# Patient Record
Sex: Male | Born: 1960
Health system: Southern US, Community
[De-identification: ages and names within clinical notes are randomized; demographics above are authoritative.]

## PROBLEM LIST (undated history)

## (undated) DIAGNOSIS — I10 Essential (primary) hypertension: Secondary | ICD-10-CM

## (undated) DIAGNOSIS — M545 Low back pain, unspecified: Secondary | ICD-10-CM

## (undated) HISTORY — PX: KNEE ARTHROPLASTY: SHX992

## (undated) HISTORY — PX: BACK SURGERY: SHX140

## (undated) HISTORY — DX: Low back pain, unspecified: M54.50

## (undated) HISTORY — PX: APPENDECTOMY: SHX54

---

## 2014-04-18 DIAGNOSIS — G8929 Other chronic pain: Secondary | ICD-10-CM | POA: Insufficient documentation

## 2014-04-18 DIAGNOSIS — M549 Dorsalgia, unspecified: Secondary | ICD-10-CM

## 2014-07-01 DIAGNOSIS — M542 Cervicalgia: Secondary | ICD-10-CM | POA: Insufficient documentation

## 2014-10-06 ENCOUNTER — Ambulatory Visit: Payer: Self-pay | Admitting: Physical Therapy

## 2014-10-23 ENCOUNTER — Ambulatory Visit: Payer: Worker's Compensation | Attending: Physical Medicine and Rehabilitation | Admitting: Physical Therapy

## 2014-10-23 ENCOUNTER — Encounter: Payer: Self-pay | Admitting: Physical Therapy

## 2014-10-23 DIAGNOSIS — M542 Cervicalgia: Secondary | ICD-10-CM | POA: Diagnosis not present

## 2014-10-23 DIAGNOSIS — M436 Torticollis: Secondary | ICD-10-CM | POA: Diagnosis not present

## 2014-10-23 DIAGNOSIS — M792 Neuralgia and neuritis, unspecified: Secondary | ICD-10-CM | POA: Diagnosis present

## 2014-10-23 NOTE — Therapy (Signed)
[place], [name], [address] Phone: [phone]   Fax:  [phone]  Physical Therapy Evaluation  Patient Details  Name: Roy Watson MRN: [id number] Date of Birth: 02-11-1961 Referring Provider:  Juanda Bond, MD  Encounter Date: 10/23/2014      PT End of Session - 10/23/14 1626    Visit Number 1   Number of Visits 16  1 visit approved initially   Date for PT Re-Evaluation 12/18/14   PT Start Time 9242   PT Stop Time 1632   PT Time Calculation (min) 49 min   Activity Tolerance Patient tolerated treatment well      History reviewed. No pertinent past medical history.  History reviewed. No pertinent past surgical history.  There were no vitals taken for this visit.  Visit Diagnosis:  Radicular pain in right arm  Stiffness of cervical spine  Trigger point with neck pain      Subjective Assessment - 10/23/14 1549    Symptoms Neck pain, Rt. shoulder pain, radiating to hand, numbness in Rt. hand/arm, denies weakness, neck stiffness.  Symptoms limit work abilities, sleep/rest and ability to be physically active.    Pertinent History Injured at work July 2015, lumbar surgery x2 (1989, 2000) and HTN   Limitations Reading;Sitting;House hold activities  looking up, and side to side/driving   How long can you sit comfortably? afftects low back   How long can you stand comfortably? affects low back (not neck)   Diagnostic tests XR, MRI per patient (bulging disc)   Patient Stated Goals Pain relief, avoiding surgery and spinal epidural injections   Currently in Pain? Yes   Pain Score 7    Pain Location Neck   Pain Orientation Mid;Posterior;Right   Pain Descriptors / Indicators Aching;Sore   Pain Type Chronic pain   Pain Radiating Towards Rt. UE    Pain Onset More than a month ago   Pain Frequency Intermittent   Aggravating Factors  prolonged looking up or down   Pain Relieving  Factors massage, heat, ice, rest   Multiple Pain Sites Yes   Pain Score 4   Pain Type Chronic pain   Pain Location Back          [facility] PT Assessment - 10/23/14 1621    Assessment   Medical Diagnosis cervical pain   Onset Date 03/26/14   Prior Therapy No   Precautions   Precautions None   Restrictions   Weight Bearing Restrictions No   Prior Function   Vocation Full time employment   Vocation Requirements building mgr, lifting, climbing ladders   Cognition   Overall Cognitive Status Within Functional Limits for tasks assessed   Observation/Other Assessments   Focus on Therapeutic Outcomes (FOTO)  46% limited   Sensation   Light Touch Appears Intact   AROM   Cervical Flexion 35   Cervical Extension 42   Cervical - Right Side Bend 35   Cervical - Left Side Bend 35   Cervical - Right Rotation 55   Cervical - Left Rotation 50  pain   Strength   Right Shoulder Flexion 4/5   Right Shoulder ABduction 4+/5   Left Shoulder Flexion 4+/5   Left Shoulder ABduction 4+/5   Palpation   Palpation Trigger points R/L upper traps, sore and painful in posterior cervicals and Rt shoulder           [facility] Adult PT Treatment/Exercise - 10/23/14 1621    Posture/Postural  Control   Postural Limitations Forward head;Decreased thoracic kyphosis   Neck Exercises: Supine   Neck Retraction 5 reps;5 secs   Manual Therapy   Manual Therapy Myofascial release;Passive ROM;Manual Traction   Myofascial Release uboccipitals   Passive ROM rotation to L and lat flexion   Manual Traction initially increased pain in Rt. UE then was reduced with slight L lateral flexion   Neck Exercises: Stretches   Upper Trapezius Stretch 3 reps;20 seconds      Self care: posture, initial HEP technique and purpose, trigger points, POC (traction- patient has and reports a "bowling ball"feeling after use (had feels heavy and difficulty to lift)  Dry needling benefit and therapeutic massage, spinal curves  (normal)        PT Short Term Goals - 10/23/14 2106    PT SHORT TERM GOAL #1   Title Pt will be I with initial HEP   Time 4   Period Weeks   Status New   PT SHORT TERM GOAL #2   Title Pt wil be able to report pain decreased in neck and Rt. UE to less than 5/10 most days of the week following work days   Time 4   Period Weeks   Status New   PT SHORT TERM GOAL #3   Title Pt will be able to turn head in each direction with min pain to ease driving.    Time 4   Period Weeks   Status New   PT SHORT TERM GOAL #4   Title Pt will be able to demo safe lifting, body mechanics related to work tasks.    Time 4   Period Weeks   Status New           PT Long Term Goals - 10/23/14 2109    PT LONG TERM GOAL #1   Title Pt will be I with advanced HEP for neck and UE/posture   Time 8   Period Weeks   Status New   PT LONG TERM GOAL #2   Title Pt wil be able to lift arms overhad and maintain for short periods without increased neck pain (work/maintenance)   Time 8   Period Weeks   Status New   PT LONG TERM GOAL #3   Title Pt will demo full AROM in cervical spine with no pain, only stretch    Time 8   Period Weeks   Status New   PT LONG TERM GOAL #4   Title Pt will demo 5/5 UE strength (Rt.) for work   Time 8   Period Weeks   Status New   PT LONG TERM GOAL #5   Title Pt will be able to read for 20 min or more with min increase in neck pain   Time 8   Period Weeks   Status New   Additional Long Term Goals   Additional Long Term Goals Yes   PT LONG TERM GOAL #6   Title Pt will reduce FOTO limitation to 35% or less limited to demo increased functional mobility   Time [place] - 10/23/14 2047    Clinical Impression Statement Patient presents with signs and symptoms consistent with cervical radiculopathy with Rt. UE pain, sensory disturbance, mild Rt sided weakness.  He was able to reduce symptoms after light cervical stretching  an postural correction, manual theapy.  Pt will benefit from skilled therapeutic intervention in order to improve on the following deficits Decreased range of motion;Impaired flexibility;Postural dysfunction;Impaired sensation;Increased fascial restricitons;Pain;Impaired UE functional use;Decreased mobility;Decreased strength   Rehab Potential Excellent   PT Frequency 2x / week   PT Duration 8 weeks   PT Treatment/Interventions ADLs/Self Care Home Management;Moist Heat;Therapeutic activities;Patient/family education;Therapeutic exercise;Traction;Passive range of motion;Dry needling;Manual techniques;Ultrasound;Cryotherapy;Neuromuscular re-education;Functional mobility training;Electrical Stimulation   PT Next Visit Plan uppe trap stretching, levator, retraction: add manual PT and try US/IFC   PT Home Exercise Plan chin tuck and UT stretch   Consulted and Agree with Plan of Care Patient         Problem List There are no active problems to display for this patient.   Roy Watson 10/23/2014, 9:28 PM  [facility] [address] [place], [name], [address] Phone: [phone]   Fax:  [phone]   Raeford Razor, PT 10/24/2014 10:57 AM Phone: [phone] Fax: [phone]

## 2014-10-23 NOTE — Patient Instructions (Signed)
Nod: Deep Cervical Flexor Retrain - Supine   Nod head, tipping chin down. Tighten muscles in back of throat. Hold __5_ seconds. Do ___5-10   times, __2-3_ times per day.  http://ss.exer.us/192   Copyright  VHI. All rights reserved.    Flexibility: Upper Trapezius Stretch   Gently grasp right side of head while reaching behind back with other hand. Tilt head away until a gentle stretch is felt. Hold _20-30___ seconds. Repeat __3__ times to Lt. Then 1 time to the Rt.  Do _1___ sets per session. Do ___2_ sessions per day.  http://orth.exer.us/340   Copyright  VHI. All rights reserved.

## 2014-10-24 NOTE — Addendum Note (Signed)
Addended by: Raeford Razor L on: 10/24/2014 12:32 PM   Modules accepted: Orders

## 2014-11-11 ENCOUNTER — Ambulatory Visit: Payer: Worker's Compensation | Attending: Physical Medicine and Rehabilitation | Admitting: Rehabilitation

## 2014-11-11 DIAGNOSIS — M542 Cervicalgia: Secondary | ICD-10-CM | POA: Insufficient documentation

## 2014-11-11 DIAGNOSIS — M436 Torticollis: Secondary | ICD-10-CM

## 2014-11-11 DIAGNOSIS — M792 Neuralgia and neuritis, unspecified: Secondary | ICD-10-CM | POA: Diagnosis not present

## 2014-11-11 NOTE — Patient Instructions (Signed)
Levator Stretch   Grasp seat or sit on hand on side to be stretched. Turn head toward other side and look down. Use hand on head to gently stretch neck in that position. Hold _20-30___ seconds. Repeat on other side. Repeat _3___ times. Do __2__ sessions per day.  Copyright  VHI. All rights reserved.  Chin Protraction / Retraction   Slide head forward keeping chin level. Slide head back, pulling chin in. Hold each position __5_ seconds. Repeat _10__ times. Do _2__ sessions per day.  Copyright  VHI. All rights reserved.

## 2014-11-11 NOTE — Therapy (Signed)
[place], [name], [address] Phone: [phone]   Fax:  [phone]  Physical Therapy Treatment  Patient Details  Name: Roy Watson MRN: [id number] Date of Birth: [birth year] Referring Provider:  Juanda Bond, MD  Encounter Date: 11/11/2014      PT End of Session - 11/11/14 1643    Visit Number 2   Number of Visits 18  approved   Date for PT Re-Evaluation 12/18/14   PT Start Time 0430   PT Stop Time 0516   PT Time Calculation (min) 46 min      No past medical history on file.  No past surgical history on file.  There were no vitals taken for this visit.  Visit Diagnosis:  Stiffness of cervical spine  Radicular pain in right arm  Trigger point with neck pain      Subjective Assessment - 11/11/14 1633    Symptoms a little sore, about the same. intermittent arm pain. Mild numbness worse at night   Currently in Pain? Yes   Pain Score 7    Pain Location Neck   Pain Orientation Mid;Posterior;Right   Pain Descriptors / Indicators Aching;Sore   Pain Type Chronic pain   Pain Onset More than a month ago   Pain Frequency Intermittent   Aggravating Factors  prolonged computer work   Pain Relieving Factors massage heat ice   Multiple Pain Sites No                    [facility] Adult PT Treatment/Exercise - 11/11/14 0001    Neck Exercises: Supine   Neck Retraction 10 reps;5 secs   Modalities   Modalities Electrical Stimulation;Moist Heat   Moist Heat Therapy   Number Minutes Moist Heat 15 Minutes   Moist Heat Location --  neck   Electrical Stimulation   Electrical Stimulation Location neck   Electrical Stimulation Action IFC   Electrical Stimulation Parameters 9   Electrical Stimulation Goals Pain   Manual Therapy   Manual Therapy Massage;Passive ROM   Massage cervical paraspinals, upper traps   Passive ROM lateral flexion c spine   Neck Exercises: Stretches   Upper Trapezius Stretch 3 reps;20 seconds   Levator Stretch 3 reps;20 seconds                PT Education - 11/11/14 1642    Education provided Yes   Education Details Self Care: work station set up ; hep: levator stretch, chin tucks seated   Person(s) Educated Patient   Methods Explanation   Comprehension Verbalized understanding          PT Short Term Goals - 10/23/14 2106    PT SHORT TERM GOAL #1   Title Pt will be I with initial HEP   Time 4   Period Weeks   Status New   PT SHORT TERM GOAL #2   Title Pt wil be able to report pain decreased in neck and Rt. UE to less than 5/10 most days of the week following work days   Time 4   Period Weeks   Status New   PT SHORT TERM GOAL #3   Title Pt will be able to turn head in each direction with min pain to ease driving.    Time 4   Period Weeks   Status New   PT SHORT TERM GOAL #4   Title Pt will be able to demo safe lifting, body mechanics related  to work tasks.    Time 4   Period Weeks   Status New           PT Long Term Goals - 10/23/14 2109    PT LONG TERM GOAL #1   Title Pt will be I with advanced HEP for neck and UE/posture   Time 8   Period Weeks   Status New   PT LONG TERM GOAL #2   Title Pt wil be able to lift arms overhad and maintain for short periods without increased neck pain (work/maintenance)   Time 8   Period Weeks   Status New   PT LONG TERM GOAL #3   Title Pt will demo full AROM in cervical spine with no pain, only stretch    Time 8   Period Weeks   Status New   PT LONG TERM GOAL #4   Title Pt will demo 5/5 UE strength (Rt.) for work   Time 8   Period Weeks   Status New   PT LONG TERM GOAL #5   Title Pt will be able to read for 20 min or more with min increase in neck pain   Time 8   Period Weeks   Status New   Additional Long Term Goals   Additional Long Term Goals Yes   PT LONG TERM GOAL #6   Title Pt will reduce FOTO limitation to 35% or less limited to demo increased  functional mobility   Time 8   Period Weeks   Status New               Plan - 11/11/14 1643    Clinical Impression Statement able to add to hep levator stretch and chin tucks in seated. Trial of IFC with HMP in clinic. Increased radicular sx during massage and PROM.    PT Next Visit Plan try [place], try traction continue therex        Problem List There are no active problems to display for this patient.   Dorene Ar, [place] 11/11/2014, 5:10 PM  [place], [name], [address] Phone: [phone]   Fax:  [phone]

## 2014-11-13 ENCOUNTER — Ambulatory Visit: Payer: Worker's Compensation | Attending: Physical Medicine and Rehabilitation | Admitting: Physical Therapy

## 2014-11-13 DIAGNOSIS — M436 Torticollis: Secondary | ICD-10-CM | POA: Insufficient documentation

## 2014-11-13 DIAGNOSIS — M792 Neuralgia and neuritis, unspecified: Secondary | ICD-10-CM | POA: Insufficient documentation

## 2014-11-13 DIAGNOSIS — M542 Cervicalgia: Secondary | ICD-10-CM | POA: Diagnosis not present

## 2014-11-13 NOTE — Therapy (Signed)
[place], [name], [address] Phone: [phone]   Fax:  [phone]  Physical Therapy Treatment  Patient Details  Name: Roy Watson MRN: [id number] Date of Birth: [birth year] Referring Provider:  Juanda Bond, MD  Encounter Date: 11/13/2014      PT End of Session - 11/13/14 1721    Visit Number 3   Number of Visits 18   Date for PT Re-Evaluation 12/18/14   PT Start Time 0435   Activity Tolerance Patient tolerated treatment well      No past medical history on file.  No past surgical history on file.  There were no vitals taken for this visit.  Visit Diagnosis:  Radicular pain in right arm  Trigger point with neck pain  Stiffness of cervical spine      Subjective Assessment - 11/13/14 1714    Symptoms a little sore, about the same. intermittent arm pain. Mild numbness worse at night   Currently in Pain? Yes  see above   Pain Location Neck   Pain Orientation Right;Mid;Posterior   Pain Radiating Towards shoulder to 4,5th digits   Aggravating Factors  sleeping wrong   Pain Relieving Factors moving around some   Effect of Pain on Daily Activities wakes him from sleep   Multiple Pain Sites No                    [facility] Adult PT Treatment/Exercise - 11/13/14 1700    Traction   Type of Traction Cervical  New machine protocol for disc involvement with muscle guardi   Min (lbs) 5   Max (lbs) 15   Time 17   Manual Therapy   Manual Therapy --  neuromusculat trigger point release.  Rt neck shoulder                PT Education - 11/13/14 1800    Education provided Yes   Education Details How to check for pounds of pressure for home traction   Person(s) Educated Patient   Methods Explanation;Demonstration   Comprehension Verbalized understanding          PT Short Term Goals - 11/13/14 1726    PT SHORT TERM GOAL #1   Status On-going   PT SHORT TERM  GOAL #2   Status On-going   PT SHORT TERM GOAL #3   Status Unable to assess   PT SHORT TERM GOAL #4   Status Unable to assess           PT Long Term Goals - 11/13/14 1726    PT LONG TERM GOAL #1   Status On-going   PT LONG TERM GOAL #2   Status Unable to assess   PT LONG TERM GOAL #3   Status Unable to assess   PT LONG TERM GOAL #4   Status Unable to assess               Plan - 11/13/14 1722    Clinical Impression Statement small cue for body posture for stretching, manual today with traction trial.  Able to decrease shoulder pain.   PT Next Visit Plan try [place], traction continue therex        Problem List There are no active problems to display for this patient.  Melvenia Needles, PTA 11/13/2014 6:01 PM Phone: [phone] Fax: [phone]  Melvenia Needles 11/13/2014, 6:01 PM  [facility] [address] [place], [name], [address] Phone: [phone]  Fax:  [phone]

## 2014-11-13 NOTE — Patient Instructions (Signed)
Use much less pounds of pressure for home traction.

## 2014-11-18 ENCOUNTER — Ambulatory Visit: Payer: Worker's Compensation | Admitting: Physical Therapy

## 2014-11-18 DIAGNOSIS — M436 Torticollis: Secondary | ICD-10-CM

## 2014-11-18 DIAGNOSIS — M792 Neuralgia and neuritis, unspecified: Secondary | ICD-10-CM | POA: Diagnosis not present

## 2014-11-18 DIAGNOSIS — M542 Cervicalgia: Secondary | ICD-10-CM

## 2014-11-18 NOTE — Therapy (Signed)
[place], [name], [address] Phone: [phone]   Fax:  [phone]  Physical Therapy Treatment  Patient Details  Name: Roy Watson MRN: [id number] Date of Birth: September 09, 1961 Referring Provider:  Juanda Bond, MD  Encounter Date: 11/18/2014      PT End of Session - 11/18/14 1803    Visit Number 4   Number of Visits 18   Date for PT Re-Evaluation 12/18/14   PT Start Time 7989   PT Stop Time 1740   PT Time Calculation (min) 65 min   Activity Tolerance Patient tolerated treatment well;Patient limited by pain      No past medical history on file.  No past surgical history on file.  There were no vitals taken for this visit.  Visit Diagnosis:  Trigger point with neck pain  Stiffness of cervical spine      Subjective Assessment - 11/18/14 1635    Symptoms tingling worse at night,  Things are about the same..  doing his hom exercises.                    [facility] Adult PT Treatment/Exercise - 11/18/14 0001    Neck Exercises: Supine   Neck Retraction 5 reps  10 seconds using towel for support     Cervical Rotation 5 reps  10 seconds with towel for self snag, added to home  as need   Cervical Rotation Limitations 60 actively  More motion with towel-muscular , AROM -feels nerve limited   Ultrasound   Ultrasound Location Neck  Rt upper traps.   Ultrasound Parameters 100%, 1.5 watts /cm2, 1 MgHz   Ultrasound Goals Pain   Traction   Type of Traction Cervical   Min (lbs) 8   Max (lbs) 18   Hold Time --  Protocol for Disc involvement with muscle guarding.   Time 18   Manual Therapy   Manual Therapy --  neuromusculat trigger point release.  Rt neck shoulder                  PT Short Term Goals - 11/18/14 1801    PT SHORT TERM GOAL #2   Baseline 7/10 average     Time 4   Status On-going           PT Long Term Goals - 11/18/14 1801    PT LONG TERM  GOAL #2   Title Pt wil be able to lift arms overhad and maintain for short periods without increased neck pain (work/maintenance)   Baseline depending on position can do some.   Time 8   Status Partially Met   PT LONG TERM GOAL #5   Title Pt will be able to read for 20 min or more with min increase in neck pain   Baseline 6-8 minutes at he most   Time 8   Period Weeks   Status On-going               Plan - 11/18/14 1734    Clinical Impression Statement Pain with ROM unchanged.  Patient feels it needs to POP.  No new goals met.   PT Next Visit Plan Stabilization exersises, continue posture education.  Hold traction if it does not make a difference.          Problem List There are no active problems to display for this patient.   [place] 11/18/2014, 6:04 PM  [place] [facility]  [place], [name], [address] Phone: [phone]   Fax:  [phone]

## 2014-11-20 ENCOUNTER — Ambulatory Visit: Payer: Worker's Compensation | Admitting: Physical Therapy

## 2014-11-20 DIAGNOSIS — M792 Neuralgia and neuritis, unspecified: Secondary | ICD-10-CM | POA: Diagnosis not present

## 2014-11-20 DIAGNOSIS — M436 Torticollis: Secondary | ICD-10-CM

## 2014-11-20 NOTE — Therapy (Signed)
[place], [name], [address] Phone: [phone]   Fax:  [phone]  Physical Therapy Treatment  Patient Details  Name: Roy Watson MRN: [id number] Date of Birth: Feb 09, 1961 Referring Provider:  Juanda Bond, MD  Encounter Date: 11/20/2014      PT End of Session - 11/20/14 1709    Visit Number 5   Number of Visits 18   Date for PT Re-Evaluation 12/18/14   PT Start Time 1632   PT Stop Time 1710   PT Time Calculation (min) 38 min   Activity Tolerance Patient tolerated treatment well      No past medical history on file.  No past surgical history on file.  There were no vitals filed for this visit.  Visit Diagnosis:  Stiffness of cervical spine      Subjective Assessment - 11/20/14 1643    Symptoms No change in pain yet                       [facility] Adult PT Treatment/Exercise - 11/20/14 1647    Posture/Postural Control   Posture Comments doorway stretch added to home   Neck Exercises: Supine   Other Supine Exercise Cervical stabilization2 10 reps, 4 LBS with thoracic roll   Other Supine Exercise Scapular band unattached added to home, posture much improved                PT Education - 11/20/14 1709    Education Details Drawer Cervical Stabilization2 and scapular un attached   Person(s) Educated Patient   Methods Explanation;Demonstration;Handout;Verbal cues;Tactile cues   Comprehension Verbalized understanding;Returned demonstration          PT Short Term Goals - 11/18/14 1801    PT SHORT TERM GOAL #2   Baseline 7/10 average     Time 4   Status On-going           PT Long Term Goals - 11/18/14 1801    PT LONG TERM GOAL #2   Title Pt wil be able to lift arms overhad and maintain for short periods without increased neck pain (work/maintenance)   Baseline depending on position can do some.   Time 8   Status Partially Met   PT LONG TERM  GOAL #5   Title Pt will be able to read for 20 min or more with min increase in neck pain   Baseline 6-8 minutes at he most   Time 8   Period Weeks   Status On-going               Plan - 11/20/14 1710    Clinical Impression Statement Pain unchanged , no new goals met.  Able to make progress toward home exercise goal.   PT Next Visit Plan review New exercises.  No PT next week , late appointments unavailable.   Consulted and Agree with Plan of Care Patient        Problem List There are no active problems to display for this patient.  Melvenia Needles, PTA 11/20/2014 5:12 PM Phone: [phone] Fax: [phone]  [facility] 11/20/2014, 5:12 PM  [facility] [address] [place], [name], [address] Phone: [phone]   Fax:  [phone]

## 2014-12-02 ENCOUNTER — Ambulatory Visit: Payer: Worker's Compensation | Admitting: Physical Therapy

## 2014-12-02 DIAGNOSIS — M542 Cervicalgia: Secondary | ICD-10-CM

## 2014-12-02 DIAGNOSIS — M436 Torticollis: Secondary | ICD-10-CM | POA: Diagnosis not present

## 2014-12-02 NOTE — Therapy (Signed)
[place], [name], [address] Phone: [phone]   Fax:  [phone]  Physical Therapy Treatment  Patient Details  Name: Roy Watson MRN: [id number] Date of Birth: [birth year] Referring Provider:  Juanda Bond, MD  Encounter Date: 12/02/2014      PT End of Session - 12/02/14 1731    Visit Number 6   Number of Visits 18   Date for PT Re-Evaluation 12/18/14   PT Start Time 9518   PT Stop Time 1736   PT Time Calculation (min) 61 min   Activity Tolerance Patient tolerated treatment well      No past medical history on file.  No past surgical history on file.  There were no vitals filed for this visit.  Visit Diagnosis:  Stiffness of cervical spine  Trigger point with neck pain      Subjective Assessment - 12/02/14 1727    Symptoms No Pt last week, no late appointments.  PT helps decrease stress, tension lasting into the evening.  Pain continues to be intermittant, average 7/10  This has not changed.  He requests  soft tissue work and traction today                       [facility] PT Treatment/Exercise - 12/02/14 0001    Traction   Type of Traction Cervical   Min (lbs) 10   Max (lbs) 20  using protocol for disc involvement with muscle guarding   Time 17   Manual Therapy   Manual Therapy --  Neuromuscular trigger point release, using elbow at times,                PT Education - 12/02/14 1728    Education provided Yes   Education Details Dry needle handout   Person(s) Educated Patient   Methods Handout   Comprehension --  did not review.  He will read at home          PT Short Term Goals - 12/02/14 1749    PT SHORT TERM GOAL #1   Title Pt will be I with initial HEP   Baseline Able to do off and on during the day, even at traffic lights.   Time 4   Period Weeks   Status Achieved   PT SHORT TERM GOAL #2   Title Pt wil be able to report  pain decreased in neck and Rt. UE to less than 5/10 most days of the week following work days   Baseline 7/10 average - unchanged   Time 4   Period Weeks   Status On-going   PT SHORT TERM GOAL #3   Title Pt will be able to turn head in each direction with min pain to ease driving.    Baseline not measured limited range prior to manual, WNL post session.  (as has been the case in the past, unable to maintain ROM gained)   Time 4   Period Weeks   Status On-going   PT SHORT TERM GOAL #4   Title Pt will be able to demo safe lifting, body mechanics related to work tasks.    Time 4   Period Weeks   Status On-going           PT Long Term Goals - 12/02/14 1751    PT LONG TERM GOAL #1   Title Pt will be I with advanced HEP for neck and UE/posture  Time 8   Period Weeks   Status On-going   PT LONG TERM GOAL #2   Title Pt wil be able to lift arms overhad and maintain for short periods without increased neck pain (work/maintenance)   Baseline can do some for short periods, depending on position   Time 8   Status On-going   PT LONG TERM GOAL #3   Title Pt will demo full AROM in cervical spine with no pain, only stretch    Baseline inconsistant   Time 8   Period Weeks   Status On-going   PT LONG TERM GOAL #4   Title Pt will demo 5/5 UE strength (Rt.) for work   Time 8   Status Unable to assess   PT LONG TERM GOAL #5   Title Pt will be able to read for 20 min or more with min increase in neck pain   Time 8   Period Weeks   Status On-going   PT LONG TERM GOAL #6   Title Pt will reduce FOTO limitation to 35% or less limited to demo increased functional mobility   Time 8   Period Weeks   Status Unable to assess               Plan - 12/02/14 1739    Clinical Impression Statement ERO next, thinking about dry needleing.  No real goals met, I have not been able to move him past manual into exercise  ROM WNL post session Rotations, flexion, extension limited 50%   PT Next  Visit Plan ERO, Dry needle,  work toward exercise goals.   Consulted and Agree with Plan of Care Patient        Problem List There are no active problems to display for this patient.  Melvenia Needles, PTA 12/02/2014 5:54 PM Phone: [phone] Fax: [phone]  [facility] 12/02/2014, 5:54 PM  [facility] [address] [place], [name], [address] Phone: [phone]   Fax:  [phone]

## 2014-12-04 ENCOUNTER — Ambulatory Visit: Payer: Worker's Compensation | Attending: Physical Medicine and Rehabilitation | Admitting: Physical Therapy

## 2014-12-04 DIAGNOSIS — M436 Torticollis: Secondary | ICD-10-CM

## 2014-12-04 DIAGNOSIS — M542 Cervicalgia: Secondary | ICD-10-CM | POA: Insufficient documentation

## 2014-12-04 DIAGNOSIS — M792 Neuralgia and neuritis, unspecified: Secondary | ICD-10-CM | POA: Insufficient documentation

## 2014-12-04 NOTE — Patient Instructions (Signed)

## 2014-12-04 NOTE — Therapy (Signed)
[name], [name], [address] Phone: [phone]   Fax:  [phone]  Physical Therapy Treatment  Patient Details  Name: Roy Watson MRN: [id number] Date of Birth: 05/30/1961 Referring Provider:  Juanda Bond, MD  Encounter Date: 12/04/2014      PT End of Session - 12/04/14 1732    Visit Number 7   Number of Visits 18   Date for PT Re-Evaluation 12/18/14   PT Start Time 5883   PT Stop Time 1739   PT Time Calculation (min) 55 min   Activity Tolerance Patient tolerated treatment well      No past medical history on file.  No past surgical history on file.  There were no vitals filed for this visit.  Visit Diagnosis:  Stiffness of cervical spine  Trigger point with neck pain  Radicular pain in right arm      Subjective Assessment - 12/04/14 1646    Symptoms (p) Intermittent right UE symptoms this AM, texting.   Better today than on Tuesday.  Traction and deep massage helped but on the way home my head felt like a bowling ball and sore.   My motion (turning) right and left hurts the most.   Currently in Pain? (p) Yes   Pain Score (p) 5    Pain Location (p) Neck   Pain Orientation (p) Right                       [facility] Adult PT Treatment/Exercise - 12/04/14 1726    Neck Exercises: Supine   Cervical Rotation 5 reps;Both   Lateral Flexion Both;5 reps   Other Supine Exercise right UE neural glide 10x   Moist Heat Therapy   Number Minutes Moist Heat 12 Minutes   Moist Heat Location --  cervical supine   Manual Therapy   Manual Therapy Myofascial release;Manual Traction;Joint mobilization   Joint Mobilization C4-T1 PA and lateral glides left grade 3/4 30 sec each level   Myofascial Release right upper trap, levator scap    Passive ROM T sp seated distraction grade 4 4x   Manual Traction 5x 20 sec          Trigger Point Dry Needling - 12/04/14 1745    Consent Given? Yes  Patient aware of risks including pneumothorax signs/symptoms   Education Handout Provided Yes   Muscles Treated Upper Body Upper trapezius;Oblique capitus;Levator scapulae   Upper Trapezius Response Twitch reponse elicited;Palpable increased muscle length   Oblique Capitus Response Palpable increased muscle length   Levator Scapulae Response Twitch response elicited;Palpable increased muscle length              PT Education - 12/04/14 1732    Education provided Yes   Education Details dry needle info   Person(s) Educated Patient   Methods Explanation;Demonstration;Handout   Comprehension Verbalized understanding          PT Short Term Goals - 12/04/14 1746    PT SHORT TERM GOAL #1   Title Pt will be I with initial HEP   Status Achieved   PT SHORT TERM GOAL #2   Title Pt wil be able to report pain decreased in neck and Rt. UE to less than 5/10 most days of the week following work days   Time 4   Period Weeks   Status Partially Met   PT SHORT TERM GOAL #3   Title Pt will be able to turn  head in each direction with min pain to ease driving.    Time 4   Period Weeks   Status On-going   PT SHORT TERM GOAL #4   Title Pt will be able to demo safe lifting, body mechanics related to work tasks.    Time 4   Period Weeks   Status On-going           PT Long Term Goals - 12/04/14 1746    PT LONG TERM GOAL #1   Title Pt will be I with advanced HEP for neck and UE/posture   Time 8   Period Weeks   Status On-going   PT LONG TERM GOAL #2   Title Pt wil be able to lift arms overhad and maintain for short periods without increased neck pain (work/maintenance)   Time 8   Period Weeks   Status On-going   PT LONG TERM GOAL #3   Title Pt will demo full AROM in cervical spine with no pain, only stretch    Time 8   Period Weeks   Status On-going   PT LONG TERM GOAL #4   Title Pt will demo 5/5 UE strength (Rt.) for work   Time 8   Period Weeks    Status On-going   PT LONG TERM GOAL #5   Title Pt will be able to read for 20 min or more with min increase in neck pain   Time 8   Period Weeks   Status On-going   PT LONG TERM GOAL #6   Title Pt will reduce FOTO limitation to 35% or less limited to demo increased functional mobility   Time 8   Period Weeks   Status On-going               Plan - 12/04/14 1739    Clinical Impression Statement No further progress toward goals.  Today's treatment altered to try new interventions secondary to minimal lasting changes.  Trigger/tender points in right upper trap and levator scap.  Improved muscle length following needling.  Good response to C4-T 1 PA and lateral glides.  Improved neural mobility  following glides.  Post treatment soreness as anticipated following new treatment interventions.     PT Next Visit Plan Assess response from dry needling and other manual interventions. Recheck Cervical AROM.   Consider prone scapular exercises.  Will seek W/C date extension beyond 3/31 to 4/29        Problem List There are no active problems to display for this patient.   Alvera Singh 12/04/2014, 5:49 PM  [facility] [address] [place], [name], [address] Phone: [phone]   Fax:  [phone]  Ruben Im, PT 12/04/2014 5:50 PM Phone: [phone] Fax: [phone]

## 2014-12-08 ENCOUNTER — Ambulatory Visit: Payer: Worker's Compensation | Admitting: Physical Therapy

## 2014-12-08 DIAGNOSIS — M436 Torticollis: Secondary | ICD-10-CM

## 2014-12-08 NOTE — Therapy (Signed)
[place], [name], [address] Phone: [phone]   Fax:  [phone]  Physical Therapy Treatment  Patient Details  Name: Roy Watson MRN: [id number] Date of Birth: [birth year] Referring Provider:  Juanda Bond, MD  Encounter Date: 12/08/2014      PT End of Session - 12/08/14 1739    Visit Number 8   Number of Visits 18   Date for PT Re-Evaluation 12/18/14   PT Start Time 1219   PT Stop Time 1717   PT Time Calculation (min) 38 min   Activity Tolerance Patient tolerated treatment well;Patient limited by pain      No past medical history on file.  No past surgical history on file.  There were no vitals filed for this visit.  Visit Diagnosis:  Stiffness of cervical spine      Subjective Assessment - 12/08/14 1648    Symptoms Needle has helped turning head.  No catching.  WNL  Rotations.     Currently in Pain? Yes   Pain Score 5    Pain Location Neck   Pain Orientation Right;Mid   Pain Radiating Towards upper trap   Aggravating Factors  holding head one position,  looking down reaching over head   Pain Relieving Factors moving out of same position.   Effect of Pain on Daily Activities Limits reading 5 minutes at a time.   Multiple Pain Sites No                       [facility] Adult PT Treatment/Exercise - 12/08/14 1705    Neck Exercises: Machines for Strengthening   UBE (Upper Arm Bike) 6  1.5, forward,  Irritated Rt upper traps when trying reverse    [employer] for Upper Extremity Motion --  Lat pull down 2 sets 10 reps 2 plates cues, posture.   Neck Exercises: Supine   Cervical Isometrics --  band series, yellow , nawwow wide, D1,  er  10 reps each hor   Shoulder Flexion --  narrow and wide,10 reps each with yellow bands.added to home   Shoulder ABduction Both;10 reps  yellow band, added to home   Upper Extremity D1 10 reps;Theraband   Theraband Level (UE D1) --   yellow band, added to home exercise   Other Supine Exercise Tried foam roller, unable to control core enough to work arms with theraband, so exercises peformed ion mat.   Shoulder Exercises: ROM/Strengthening   Wall Pushups --  too easy, so did counter push ups, cues for form.   Other ROM/Strengthening Exercises counter push up. 20 reps, needed cued for form   Neck Exercises: Stretches   Corner Stretch --  doorway stretch, 3 reps, 30 seconds,                PT Education - 12/08/14 1737    Education provided Yes   Education Details Band exercises in hooklying, yellow.  Overhead pull wide and narrow grip, ER, Horizontal abduction, D1.   Person(s) Educated Patient   Methods Explanation;Demonstration;Tactile cues;Verbal cues;Handout   Comprehension Verbalized understanding;Returned demonstration          PT Short Term Goals - 12/08/14 1743    PT SHORT TERM GOAL #1   Title Pt will be I with initial HEP   Time 4   Period Weeks   Status Achieved   PT SHORT TERM GOAL #2   Title Pt wil be able to report  pain decreased in neck and Rt. UE to less than 5/10 most days of the week following work days   Time 4   Period Weeks   Status Partially Met   PT SHORT TERM GOAL #3   Title Pt will be able to turn head in each direction with min pain to ease driving.    Baseline WNL today, slightly less range to RT.  Patient did not know if drivimg was any easier.   Time 4   Period Weeks   Status On-going   PT SHORT TERM GOAL #4   Title Pt will be able to demo safe lifting, body mechanics related to work tasks.    Time 4   Period Weeks   Status On-going           PT Long Term Goals - 12/08/14 1744    PT LONG TERM GOAL #1   Title Pt will be I with advanced HEP for neck and UE/posture   Baseline adherent   Time 8   Period Weeks   Status On-going   PT LONG TERM GOAL #2   Title Pt wil be able to lift arms overhad and maintain for short periods without increased neck pain  (work/maintenance)   Baseline inclinic irritated Rt shoulder (old injury) which limited wall wipe exercise.   Time 8   Period Weeks   Status On-going   PT LONG TERM GOAL #3   Title Pt will demo full AROM in cervical spine with no pain, only stretch    Baseline inconsistant   Time 8   Period Weeks   Status On-going   PT LONG TERM GOAL #4   Title Pt will demo 5/5 UE strength (Rt.) for work   Time 8   Status On-going   PT LONG TERM GOAL #5   Title Pt will be able to read for 20 min or more with min increase in neck pain   Baseline 5 minutes   Time 8   Status On-going   PT LONG TERM GOAL #6   Title Pt will reduce FOTO limitation to 35% or less limited to demo increased functional mobility   Time 8   Period Weeks   Status On-going               Plan - 12/08/14 1740    Clinical Impression Statement Focus today on progress toward strengtrhening goals.  Able to make progress toward home exercise goal.  Could not tolerate foam roller exercises due to poor core control.   PT Next Visit Plan ERO, review band exercises,  strengthening.  ask patient to work on his core.        Problem List There are no active problems to display for this patient.  Karen Harris, PTA 12/08/2014 5:47 PM Phone: [phone] Fax: [phone]   HARRIS,KAREN 12/08/2014, 5:47 PM  [facility] [address] [place], [place], [address] Phone: [phone]   Fax:  [phone]      

## 2014-12-10 ENCOUNTER — Ambulatory Visit: Payer: Worker's Compensation | Admitting: Physical Therapy

## 2014-12-10 DIAGNOSIS — M436 Torticollis: Secondary | ICD-10-CM | POA: Diagnosis not present

## 2014-12-10 DIAGNOSIS — M542 Cervicalgia: Secondary | ICD-10-CM

## 2014-12-10 NOTE — Therapy (Signed)
[place], [name], [address] Phone: [phone]   Fax:  [phone]  Physical Therapy Treatment  Patient Details  Name: Roy Watson MRN: [id number] Date of Birth: 10/30/1960 Referring Provider:  Juanda Bond, MD  Encounter Date: 12/10/2014      PT End of Session - 12/10/14 1730    Visit Number 9   Number of Visits 18   Date for PT Re-Evaluation 12/18/14   PT Start Time 1630   PT Stop Time 1718   PT Time Calculation (min) 48 min   Activity Tolerance Patient tolerated treatment well;Patient limited by pain      No past medical history on file.  No past surgical history on file.  There were no vitals filed for this visit.  Visit Diagnosis:  Stiffness of cervical spine  Trigger point with neck pain      Subjective Assessment - 12/10/14 1628    Symptoms 5/10 prior to getting under truck to fix.  Increased to 6/10 post.  Inner thigh was sore after last session.                        [facility] Adult PT Treatment/Exercise - 12/10/14 1636    Neck Exercises: Machines for Strengthening   UBE (Upper Arm Bike) 6 minutes   [employer]bex Row --  10 reps  2 sets   Other Machines for Strengthening ull down 2 plates, 2 sets of 10   Neck Exercises: Supine   Cervical Isometrics --  Band series for posture as on3/28/16 10 reps each   Neck Retraction --  10 reps onto towel roll   Other Supine Exercise Dead bug cues needed, encouraged core, head neutral.   Shoulder Exercises: ROM/Strengthening   Sustained Retraction with Theraband no bands, arm sweepfrom hips to overhead trying to keep arms on wall 5 reps, irritated shoulder.   Other ROM/Strengthening Exercises scaption2 LBS 10 reps   Other ROM/Strengthening Exercises 5 LBS each shrugs standing, both   Manual Therapy   Manual Therapy --  Neuromuscular trigger point relases Rt neck, upper trap                   PT Short Term  Goals - 12/08/14 1743    PT SHORT TERM GOAL #1   Title Pt will be I with initial HEP   Time 4   Period Weeks   Status Achieved   PT SHORT TERM GOAL #2   Title Pt wil be able to report pain decreased in neck and Rt. UE to less than 5/10 most days of the week following work days   Time 4   Period Weeks   Status Partially Met   PT SHORT TERM GOAL #3   Title Pt will be able to turn head in each direction with min pain to ease driving.    Baseline WNL today, slightly less range to RT.  Patient did not know if drivimg was any easier.   Time 4   Period Weeks   Status On-going   PT SHORT TERM GOAL #4   Title Pt will be able to demo safe lifting, body mechanics related to work tasks.    Time 4   Period Weeks   Status On-going           PT Long Term Goals - 12/08/14 1744    PT LONG TERM GOAL #1   Title Pt will be I  with advanced HEP for neck and UE/posture   Baseline adherent   Time 8   Period Weeks   Status On-going   PT LONG TERM GOAL #2   Title Pt wil be able to lift arms overhad and maintain for short periods without increased neck pain (work/maintenance)   Baseline inclinic irritated Rt shoulder (old injury) which limited wall wipe exercise.   Time 8   Period Weeks   Status On-going   PT LONG TERM GOAL #3   Title Pt will demo full AROM in cervical spine with no pain, only stretch    Baseline inconsistant   Time 8   Period Weeks   Status On-going   PT LONG TERM GOAL #4   Title Pt will demo 5/5 UE strength (Rt.) for work   Time 8   Status On-going   PT LONG TERM GOAL #5   Title Pt will be able to read for 20 min or more with min increase in neck pain   Baseline 5 minutes   Time 8   Status On-going   PT LONG TERM GOAL #6   Title Pt will reduce FOTO limitation to 35% or less limited to demo increased functional mobility   Time 8   Period Weeks   Status On-going               Plan - 12/10/14 1731    Clinical Impression Statement Inner thigh soreness  probably due to his struggle to get on/off foam roller last visit.  Able to get under truck and fix with "catch" returning as a result.  Pain in upper trap 50% of the time.  and usually increases at day's end.        Problem List There are no active problems to display for this patient.  Melvenia Needles, PTA 12/10/2014 5:35 PM Phone: [phone] Fax: [phone]   [facility] 12/10/2014, 5:35 PM  [facility] [address] [place], [name], [address] Phone: [phone]   Fax:  [phone]

## 2014-12-10 NOTE — Patient Instructions (Signed)
Asked patient to return to his back exercises for strengthening.  He now does only stretches.  Core work will help shoulders and neck too.

## 2014-12-16 ENCOUNTER — Encounter: Payer: Self-pay | Admitting: Physical Therapy

## 2014-12-18 ENCOUNTER — Ambulatory Visit: Payer: Worker's Compensation | Attending: Physical Medicine and Rehabilitation | Admitting: Physical Therapy

## 2014-12-18 DIAGNOSIS — M436 Torticollis: Secondary | ICD-10-CM | POA: Diagnosis present

## 2014-12-18 DIAGNOSIS — M792 Neuralgia and neuritis, unspecified: Secondary | ICD-10-CM | POA: Diagnosis not present

## 2014-12-18 DIAGNOSIS — M542 Cervicalgia: Secondary | ICD-10-CM | POA: Insufficient documentation

## 2014-12-18 NOTE — Therapy (Signed)
[place], [name], [address] Phone: [phone]   Fax:  [phone]  Physical Therapy Treatment/Recertification  Patient Details  Name: Roy Watson MRN: [id number] Date of Birth: 27-Nov-1960 Referring Provider:  Juanda Bond, MD  Encounter Date: 12/18/2014      PT End of Session - 12/18/14 1635    Visit Number 10   Number of Visits 18   Date for PT Re-Evaluation 01/15/15   PT Start Time 3149   PT Stop Time 1630   PT Time Calculation (min) 45 min   Activity Tolerance Patient tolerated treatment well      No past medical history on file.  No past surgical history on file.  There were no vitals filed for this visit.  Visit Diagnosis:  Stiffness of cervical spine - Plan: PT plan of care cert/re-cert  Trigger point with neck pain - Plan: PT plan of care cert/re-cert  Radicular pain in right arm - Plan: PT plan of care cert/re-cert      Subjective Assessment - 12/18/14 1550    Subjective Reports good improvement with dry needling.  Catch with right or left rotation.  Doing better with reaching overhead.  Hard to hold head up if working under a car.     Pain Score 5    Pain Location Neck   Pain Orientation Mid   Pain Type Chronic pain   Pain Onset More than a month ago   Pain Frequency Intermittent   Aggravating Factors  rotating right/left;  prolonged looking down            [facility] PT Assessment - 12/18/14 1556    Observation/Other Assessments   Focus on Therapeutic Outcomes (FOTO)  46% limit  no change   ROM / Strength   AROM / PROM / Strength AROM;Strength   AROM   Right/Left Shoulder Right;Left   Cervical Flexion 45   Cervical Extension 60   Cervical - Right Side Bend 47   Cervical - Left Side Bend 35   Cervical - Right Rotation 52   Cervical - Left Rotation 47   Strength   Strength Assessment Site Cervical   Right Shoulder Flexion 4+/5   Right Shoulder ABduction  4+/5   Left Shoulder Flexion 4+/5   Left Shoulder ABduction 4+/5   Cervical Flexion 4/5   Cervical Extension 4/5                   [facility] Adult PT Treatment/Exercise - 12/18/14 1621    Neck Exercises: Supine   Capital Flexion 10 reps;3 secs   Upper Extremity D1 10 reps;Theraband   Theraband Level (UE D1) Level 2 (Red)   Other Supine Exercise deep cervical flex with red band ex's 8 reps each with overhead, shoulder ER, HABD, diagonal flexion   Shoulder Exercises: ROM/Strengthening   Other ROM/Strengthening Exercises Standing with foam roll and tennis ball for deep cervical flex with red band shoulder rows 2x 10   Other ROM/Strengthening Exercises 20# standing lat pull downs with deep cervical flex contraction 20x                  PT Short Term Goals - 12/18/14 1641    PT SHORT TERM GOAL #1   Title Pt will be I with initial HEP   Status Achieved   PT SHORT TERM GOAL #2   Title Pt wil be able to report pain decreased in neck and Rt. UE to less  than 5/10 most days of the week following work days   Status Achieved   PT SHORT TERM GOAL #3   Title Pt will be able to turn head in each direction with min pain to ease driving.    Time 4   Period Weeks   Status On-going   PT SHORT TERM GOAL #4   Title Pt will be able to demo safe lifting, body mechanics related to work tasks.    Status Achieved           PT Long Term Goals - 12/18/14 1642    PT LONG TERM GOAL #1   Title Pt will be I with advanced HEP for neck and UE/posture   Time 8   Period Weeks   Status On-going   PT LONG TERM GOAL #2   Title Pt wil be able to lift arms overhad and maintain for short periods without increased neck pain (work/maintenance)   Status Achieved   PT LONG TERM GOAL #3   Title Pt will demo full AROM in cervical spine with no pain, only stretch    Time 8   Period Weeks   Status Partially Met   PT LONG TERM GOAL #4   Title Pt will demo 5/5 UE strength (Rt.) for work   Time 8    Period Weeks   Status On-going   PT LONG TERM GOAL #5   Title Pt will be able to read for 20 min or more with min increase in neck pain   Baseline 15 min on 12/18/14   Time 8   Period Weeks   Status On-going   PT LONG TERM GOAL #6   Title Pt will reduce FOTO limitation to 35% or less limited to demo increased functional mobility   Baseline 46% on 12/18/14   Time 8   Period Weeks   Status On-going               Plan - 12/18/14 1636    Clinical Impression Statement Patient reports a good improvement with dry needling but declines the need for it today.  Cervical flexion and extension AROM with significant improvments, min change with sidebending and rotation.  Shoulder strength grossly 4+/5.  Deep cervical flexors and extensors 4/5.  Patient responded well to training of deep flexors supine and standing.  Patient notes overall progress at "55%" although FOTO functional outcome score is unchanged.  Patient has meet partial goals and should be independent in self management and HEP safe self progression in the next 4 weeks.  Continue with treatment plan.  Resume manual interventions as needed and continue with strengthening.     Pt will benefit from skilled therapeutic intervention in order to improve on the following deficits Decreased range of motion;Impaired flexibility;Postural dysfunction;Impaired sensation;Increased fascial restricitons;Pain;Impaired UE functional use;Decreased mobility;Decreased strength   PT Frequency 2x / week   PT Duration 4 weeks   PT Treatment/Interventions ADLs/Self Care Home Management;Moist Heat;Therapeutic activities;Patient/family education;Therapeutic exercise;Traction;Passive range of motion;Dry needling;Manual techniques;Ultrasound;Cryotherapy;Neuromuscular re-education;Functional mobility training;Electrical Stimulation   PT Next Visit Plan Renewal complete; deep cervical flexor and extensor strengthening; manual interventions as needed including dry  needling; modalities as needed        Problem List There are no active problems to display for this patient.   Alvera Singh 12/18/2014, 4:46 PM  [facility] [address] [place], [name], [address] Phone: [phone]   Fax:  [phone]    Ruben Im, PT 12/18/2014  4:47 PM Phone: [phone] Fax: [phone]

## 2014-12-23 ENCOUNTER — Ambulatory Visit: Payer: Worker's Compensation | Admitting: Physical Therapy

## 2014-12-23 DIAGNOSIS — M436 Torticollis: Secondary | ICD-10-CM

## 2014-12-23 DIAGNOSIS — M792 Neuralgia and neuritis, unspecified: Secondary | ICD-10-CM

## 2014-12-23 NOTE — Therapy (Signed)
[place], [name], [address] Phone: [phone]   Fax:  [phone]  Physical Therapy Treatment  Patient Details  Name: Roy Watson MRN: [id number] Date of Birth: May 24, 1961 Referring Provider:  Juanda Bond, MD  Encounter Date: 12/23/2014      PT End of Session - 12/23/14 1720    Visit Number 11   Number of Visits 18   Date for PT Re-Evaluation 01/15/15   PT Start Time 1829   PT Stop Time 1730   PT Time Calculation (min) 59 min   Activity Tolerance Patient tolerated treatment well;Patient limited by pain      No past medical history on file.  No past surgical history on file.  There were no vitals filed for this visit.  Visit Diagnosis:  Stiffness of cervical spine  Radicular pain in right arm      Subjective Assessment - 12/23/14 1630    Subjective Just has an hour long massage.  Has been doinghis new home exercises, not as much as I need to.  Pain catches painful 7-8/10 Rotation to RT or wit pressure to the spot.   Currently in Pain? Yes   Pain Score 2   up to 8/10   Pain Location Neck   Pain Orientation Mid   Pain Radiating Towards upper trap   Aggravating Factors  rotation to Rt   Pain Relieving Factors massage                       [facility] Adult PT Treatment/Exercise - 12/23/14 1650    Neck Exercises: Machines for Strengthening   UBE (Upper Arm Bike) 6 minutes with deep neck flexor work intermittantly.   Other Machines for Strengthening lat pull down 20 LBS 2 sets of 10 with deep neck flexor use intermittantly.   Neck Exercises: Supine   Capital Flexion 10 reps   Cervical Rotation --  , with strumming to lengthen.   Lateral Flexion 5 reps  supine.  with manual strumming musculature   Upper Extremity D1 --  green band 10 reps each   Other Supine Exercise band narrow , wide ER horizontal pulls 10 to 20 Reps green band issued.  good technique   Other  Supine Exercise deep neck cervical  flexor  10 reps 5 second holds.   Shoulder Exercises: ROM/Strengthening   Other ROM/Strengthening Exercises standing braiding neutral head, velcro wlower rib height reproduced pain.  good posture observed.  better with neck stretches.   Moist Heat Therapy   Number Minutes Moist Heat 15 Minutes   Moist Heat Location --  Neck                  PT Short Term Goals - 12/18/14 1641    PT SHORT TERM GOAL #1   Title Pt will be I with initial HEP   Status Achieved   PT SHORT TERM GOAL #2   Title Pt wil be able to report pain decreased in neck and Rt. UE to less than 5/10 most days of the week following work days   Status Achieved   PT SHORT TERM GOAL #3   Title Pt will be able to turn head in each direction with min pain to ease driving.    Time 4   Period Weeks   Status On-going   PT SHORT TERM GOAL #4   Title Pt will be able to demo safe lifting, body mechanics related  to work tasks.    Status Achieved           PT Long Term Goals - 12/23/14 1727    PT LONG TERM GOAL #3   Baseline Full supine rotation pain  and stretch   Time 8   Period Weeks   Status Partially Met   PT LONG TERM GOAL #4   Title Pt will demo 5/5 UE strength (Rt.) for work   Time 8   Period Weeks   Status On-going   PT LONG TERM GOAL #5   Title Pt will be able to read for 20 min or more with min increase in neck pain   Time 8   Period Weeks   Status On-going   PT LONG TERM GOAL #6   Title Pt will reduce FOTO limitation to 35% or less limited to demo increased functional mobility   Status On-going               Plan - 12/23/14 1721    Clinical Impression Statement No pain increased after exercise, some tightness reoprted.  Pain 8/10 with job simulation of looking down  with deep neck flexor use  with radiating pain into arm.  No new goals met.   PT Next Visit Plan ; deep cervical flexor and extensor strengthening; manual interventions as needed  including dry needling; modalities as needed. Try prone retraction , upper back strengthening.   Consulted and Agree with Plan of Care Patient        Problem List There are no active problems to display for this patient.  Melvenia Needles, PTA 12/23/2014 5:42 PM Phone: [phone] Fax: [phone]  [facility] 12/23/2014, 5:42 PM  [facility] [address] [place], [name], [address] Phone: [phone]   Fax:  [phone]

## 2014-12-25 ENCOUNTER — Ambulatory Visit: Payer: Worker's Compensation | Admitting: Physical Therapy

## 2014-12-25 DIAGNOSIS — M792 Neuralgia and neuritis, unspecified: Secondary | ICD-10-CM

## 2014-12-25 DIAGNOSIS — M436 Torticollis: Secondary | ICD-10-CM | POA: Diagnosis not present

## 2014-12-25 DIAGNOSIS — M542 Cervicalgia: Secondary | ICD-10-CM

## 2014-12-25 NOTE — Patient Instructions (Signed)
Right UE neural gliding small range 5-10 reps

## 2014-12-25 NOTE — Therapy (Signed)
[place], [name], [address] Phone: [phone]   Fax:  [phone]  Physical Therapy Treatment  Patient Details  Name: Roy Watson MRN: [id number] Date of Birth: [birth year] Referring Provider:  Juanda Bond, MD  Encounter Date: 12/25/2014      PT End of Session - 12/25/14 1719    Visit Number 12   Number of Visits 18   Date for PT Re-Evaluation 01/15/15   PT Start Time 1633   PT Stop Time 1729   PT Time Calculation (min) 56 min   Activity Tolerance Patient tolerated treatment well;Patient limited by pain      No past medical history on file.  No past surgical history on file.  There were no vitals filed for this visit.  Visit Diagnosis:  Stiffness of cervical spine  Radicular pain in right arm  Trigger point with neck pain      Subjective Assessment - 12/25/14 1635    Subjective Massage felt great but they did some traction which cause UE radiating pain.     Currently in Pain? Yes   Pain Score 6    Pain Orientation Right;Mid   Pain Descriptors / Indicators Sore   Pain Radiating Towards right UE   Pain Onset More than a month ago   Pain Frequency Intermittent   Aggravating Factors  rotating right and left;  diff sleeping; computer work   Pain Relieving Factors massage                       [facility] Adult PT Treatment/Exercise - 12/25/14 1716    Shoulder Exercises: ROM/Strengthening   Rhythmic Stabilization, Supine --  contract/relax cervical rotation right/left 6x each with 5s   Other ROM/Strengthening Exercises seated right UE neural gliding of wrist/fingers/elbow 10x   Other ROM/Strengthening Exercises seated cervical rotation mobs with movement 5x right/left   Moist Heat Therapy   Number Minutes Moist Heat 12 Minutes   Moist Heat Location --  supine cervical   Manual Therapy   Joint Mobilization C4-T1 PA and lateral glides left grade 3/4 30 sec each  level   Massage cervical paraspinals, upper traps   Myofascial Release right upper trap, levator scap    Passive ROM --  contract-relax upper traps 3x 5 sec right/left   Manual Traction 5x 20 sec                PT Education - 12/25/14 1719    Education provided Yes   Education Details right UE neural gliding   Person(s) Educated Patient   Methods Explanation;Demonstration   Comprehension Verbalized understanding;Returned demonstration          PT Short Term Goals - 12/25/14 1725    PT SHORT TERM GOAL #1   Title Pt will be I with initial HEP   Status Achieved   PT SHORT TERM GOAL #2   Title Pt wil be able to report pain decreased in neck and Rt. UE to less than 5/10 most days of the week following work days   Status Achieved   PT SHORT TERM GOAL #3   Title Pt will be able to turn head in each direction with min pain to ease driving.    Time 4   Period Weeks   Status On-going   PT SHORT TERM GOAL #4   Title Pt will be able to demo safe lifting, body mechanics related to work tasks.  Status Achieved           PT Long Term Goals - 12/25/14 1725    PT LONG TERM GOAL #1   Title Pt will be I with advanced HEP for neck and UE/posture   Time 8   Period Weeks   Status On-going   PT LONG TERM GOAL #2   Title Pt wil be able to lift arms overhad and maintain for short periods without increased neck pain (work/maintenance)   Status Achieved   PT LONG TERM GOAL #3   Title Pt will demo full AROM in cervical spine with no pain, only stretch    Time 8   Status Partially Met   PT LONG TERM GOAL #4   Title Pt will demo 5/5 UE strength (Rt.) for work   Time 8   Period Weeks   Status On-going   PT LONG TERM GOAL #5   Title Pt will be able to read for 20 min or more with min increase in neck pain   Time 8   Period Weeks   Status On-going   PT LONG TERM GOAL #6   Title Pt will reduce FOTO limitation to 35% or less limited to demo increased functional mobility    Baseline 46% on 12/18/14   Time 8   Period Weeks   Status On-going               Plan - 12/25/14 1720    Clinical Impression Statement Patient declines dry needling stating it just makes him too sore.  Pain increased today secondary to patient reports of a lot of computer use at work today.  Reports a good understanding of previous HEP including deep cervical flexors.  Therapist closely montitoring symptoms throughout treatment session especially right UE.  Transient pain with C8-T1 PA and left cervical rotation but quickly dissipates.  Anticipate patient may meet remaining goals in the next 2 weeks and should be ready for home ex program and self management.     PT Next Visit Plan assess response to right UE neural gliding and mobs with movement performed today;  hold dry needling per patient request;  recheck cervical ROM        Problem List There are no active problems to display for this patient.   Alvera Singh 12/25/2014, 5:30 PM  [place], [name], [address] Phone: [phone]   Fax:  [phone]   Ruben Im, PT 12/25/2014 5:30 PM Phone: [phone] Fax: [phone]

## 2014-12-30 ENCOUNTER — Ambulatory Visit: Payer: Worker's Compensation | Attending: Physical Medicine and Rehabilitation | Admitting: Physical Therapy

## 2014-12-30 DIAGNOSIS — M792 Neuralgia and neuritis, unspecified: Secondary | ICD-10-CM

## 2014-12-30 DIAGNOSIS — M5412 Radiculopathy, cervical region: Secondary | ICD-10-CM | POA: Insufficient documentation

## 2014-12-30 DIAGNOSIS — M436 Torticollis: Secondary | ICD-10-CM

## 2014-12-30 DIAGNOSIS — M542 Cervicalgia: Secondary | ICD-10-CM

## 2014-12-30 NOTE — Therapy (Signed)
[place], [name], [address] Phone: [phone]   Fax:  [phone]  Physical Therapy Treatment/Discharge Summary  Patient Details  Name: Roy Watson MRN: [id number] Date of Birth: Apr 11, 1961 Referring Provider:  Juanda Bond, MD  Encounter Date: 12/30/2014      PT End of Session - 12/30/14 1717    Visit Number 13   Number of Visits 18   Date for PT Re-Evaluation 01/15/15   PT Start Time 82   PT Stop Time 1705   PT Time Calculation (min) 35 min   Activity Tolerance Patient limited by pain      No past medical history on file.  No past surgical history on file.  There were no vitals filed for this visit.  Visit Diagnosis:  Stiffness of cervical spine  Radicular pain in right arm  Trigger point with neck pain      Subjective Assessment - 12/30/14 1631    Subjective I felt bad after last visit, I think the isometrics aggravated it.     Currently in Pain? Yes   Pain Score 5    Pain Location Neck   Pain Orientation Right;Mid   Aggravating Factors  turning to the left is the worst; reading/looking down (neutral is not as bad)   Pain Relieving Factors stretching, massage; needling but it makes me really sore            [facility] PT Assessment - 12/30/14 1642    Observation/Other Assessments   Focus on Therapeutic Outcomes (FOTO)  worsening from intake from 46% to 52%   ROM / Strength   AROM / PROM / Strength Strength   AROM   Right/Left Shoulder Right;Left   Right Shoulder Flexion 160 Degrees   Left Shoulder Flexion 160 Degrees   Cervical Flexion 57   Cervical Extension 55   Cervical - Right Side Bend 43   Cervical - Left Side Bend 40   Cervical - Right Rotation 50   Cervical - Left Rotation 42   Strength   Right Shoulder Flexion 4+/5   Right Shoulder ABduction 4+/5   Cervical Flexion 4/5   Cervical Extension 4/5   Special Tests    Special Tests Rotator Cuff  Impingement   Rotator Cuff Impingment tests Michel Bickers test;Empty Can test;Full Can test;Lag signs at 0 degrees;Drop Arm test   Hawkins-Kennedy test   Findings Negative   Side Right   Empty Can test   Findings Negative   Side Right   Full Can test   Findings Negative   Side Right   Lag time at 0 degrees   Findings Negative   Side Right   Drop Arm test   Findings Negative   Side Right                             PT Education - 12/30/14 1717    Education provided Yes   Education Details HEP self progression   Person(s) Educated Patient   Methods Explanation   Comprehension Verbalized understanding          PT Short Term Goals - 12/30/14 1641    PT SHORT TERM GOAL #1   Title Pt will be I with initial HEP   Status Achieved   PT SHORT TERM GOAL #2   Title Pt wil be able to report pain decreased in neck and Rt. UE to less than 5/10 most  days of the week following work days   Status Achieved   PT SHORT TERM GOAL #3   Title Pt will be able to turn head in each direction with min pain to ease driving.    Status Not Met           PT Long Term Goals - 12/30/14 1644    PT LONG TERM GOAL #1   Title Pt will be I with advanced HEP for neck and UE/posture   Status Achieved   PT LONG TERM GOAL #2   Title Pt wil be able to lift arms overhad and maintain for short periods without increased neck pain (work/maintenance)   Baseline 5 min   Status Achieved   PT LONG TERM GOAL #3   Title Pt will demo full AROM in cervical spine with no pain, only stretch    Status Partially Met   PT LONG TERM GOAL #4   Title Pt will demo 5/5 UE strength (Rt.) for work   Status Partially Met   PT LONG TERM GOAL #5   Title Pt will be able to read for 20 min or more with min increase in neck pain   Status Not Met   PT LONG TERM GOAL #6   Title Pt will reduce FOTO limitation to 35% or less limited to demo increased functional mobility   Status Not Met                Plan - 12/30/14 1717    Clinical Impression Statement Patient made limited progress toward goals over 13 visits.  Multiple interventions used including modalities, therapeutic ex, manual therapy and dry needling.  He did report some overall improvement following needling but declined subsequent needling stating it made it just too sore.  He had some improvements in cervical AROM but rotation left > right continues to be painful.  Looking down for reading will aggravate and being in awkard positions at work can aggravate symptoms too.  His FOTO functional outcome score worsened from an initial score of 46% to 52% today.  Patient has shown no significant improvements in pain/function in the last several visits.  Recommend discharge from PT with partial goals met.  He plans to continue with his basic HEP including upper quarter strengthening with low level deep cervical flexor exercises which do not seem to aggravate his symptoms.  He will follow up with his doctor in a few weeks.          Problem List There are no active problems to display for this patient.   Alvera Singh 12/30/2014, 5:24 PM  [facility] [address] [place], [name], [address] Phone: [phone]   Fax:  [phone]   PHYSICAL THERAPY DISCHARGE SUMMARY  Visits from Start of Care: 13  Current functional level related to goals / functional outcomes: Minimal/partial goals met   Remaining deficits: See clinical impression above   Education / Equipment: HEP Plan: Patient agrees to discharge.  Patient goals were partially met. Patient is being discharged due to meeting the stated rehab goals.  ?????  Ruben Im, PT 12/30/2014 5:25 PM Phone: [phone] Fax: [phone]

## 2014-12-30 NOTE — Patient Instructions (Signed)
Discussion of self management of problem with deep cervical flexor strengthening and upper quarter strengthening with previously given HEP

## 2015-01-01 ENCOUNTER — Ambulatory Visit: Payer: Worker's Compensation | Admitting: Physical Therapy

## 2015-01-06 ENCOUNTER — Encounter: Payer: Self-pay | Admitting: Physical Therapy

## 2015-01-08 ENCOUNTER — Encounter: Payer: Self-pay | Admitting: Physical Therapy

## 2015-07-13 ENCOUNTER — Emergency Department
Admission: EM | Admit: 2015-07-13 | Discharge: 2015-07-13 | Disposition: A | Discharge status: Home / Self Care | Payer: 59 | Attending: Emergency Medicine | Admitting: Emergency Medicine

## 2015-07-13 ENCOUNTER — Emergency Department: Payer: 59

## 2015-07-13 ENCOUNTER — Encounter: Payer: Self-pay | Admitting: Emergency Medicine

## 2015-07-13 DIAGNOSIS — K5900 Constipation, unspecified: Secondary | ICD-10-CM | POA: Diagnosis not present

## 2015-07-13 DIAGNOSIS — R1084 Generalized abdominal pain: Secondary | ICD-10-CM

## 2015-07-13 DIAGNOSIS — I10 Essential (primary) hypertension: Secondary | ICD-10-CM | POA: Insufficient documentation

## 2015-07-13 DIAGNOSIS — Z79899 Other long term (current) drug therapy: Secondary | ICD-10-CM | POA: Insufficient documentation

## 2015-07-13 HISTORY — DX: Essential (primary) hypertension: I10

## 2015-07-13 LAB — COMPREHENSIVE METABOLIC PANEL
ALK PHOS: 82 U/L (ref 38–126)
ALT: 30 U/L (ref 17–63)
AST: 22 U/L (ref 15–41)
Albumin: 4.7 g/dL (ref 3.5–5.0)
Anion gap: 7 (ref 5–15)
BILIRUBIN TOTAL: 0.6 mg/dL (ref 0.3–1.2)
BUN: 13 mg/dL (ref 6–20)
CALCIUM: 9.5 mg/dL (ref 8.9–10.3)
CO2: 28 mmol/L (ref 22–32)
CREATININE: 1.08 mg/dL (ref 0.61–1.24)
Chloride: 104 mmol/L (ref 101–111)
GFR calc Af Amer: >60 mL/min (ref >60)
GFR calc non Af Amer: >60 mL/min (ref >60)
GLUCOSE: 95 mg/dL (ref 65–99)
POTASSIUM: 3.7 mmol/L (ref 3.5–5.1)
Sodium: 139 mmol/L (ref 135–145)
TOTAL PROTEIN: 7.9 g/dL (ref 6.5–8.1)

## 2015-07-13 LAB — URINALYSIS COMPLETE WITH MICROSCOPIC (ARMC ONLY)
BACTERIA UA: NONE SEEN
Bilirubin Urine: NEGATIVE
Glucose, UA: NEGATIVE mg/dL
Hgb urine dipstick: NEGATIVE
KETONES UR: NEGATIVE mg/dL
Leukocytes, UA: NEGATIVE
NITRITE: NEGATIVE
PROTEIN: NEGATIVE mg/dL
Specific Gravity, Urine: 1.021 (ref 1.005–1.030)
pH: 5 (ref 5.0–8.0)

## 2015-07-13 LAB — CBC
HEMATOCRIT: 44.5 % (ref 40.0–52.0)
Hemoglobin: 15.2 g/dL (ref 13.0–18.0)
MCH: 30.5 pg (ref 26.0–34.0)
MCHC: 34.2 g/dL (ref 32.0–36.0)
MCV: 89.2 fL (ref 80.0–100.0)
Platelets: 328 10*3/uL (ref 150–440)
RBC: 4.99 MIL/uL (ref 4.40–5.90)
RDW: 13.8 % (ref 11.5–14.5)
WBC: 9.9 10*3/uL (ref 3.8–10.6)

## 2015-07-13 LAB — LIPASE, BLOOD: Lipase: 25 U/L (ref 11–51)

## 2015-07-13 MED ORDER — SENNA 8.6 MG PO TABS: 2.0000 | ORAL_TABLET | Freq: Two times a day (BID) | ORAL | Status: DC

## 2015-07-13 MED ORDER — POLYETHYLENE GLYCOL 3350 17 GM/SCOOP PO POWD: ORAL | Status: AC

## 2015-07-13 MED ORDER — DOCUSATE SODIUM 100 MG PO CAPS: 200.0000 mg | ORAL_CAPSULE | Freq: Two times a day (BID) | ORAL | Status: DC

## 2015-07-13 NOTE — Discharge Instructions (Signed)
Abdominal Pain, Adult °Many things can cause abdominal pain. Usually, abdominal pain is not caused by a disease and will improve without treatment. It can often be observed and treated at home. Your health care provider will do a physical exam and possibly order blood tests and X-rays to help determine the seriousness of your pain. However, in many cases, more time must pass before a clear cause of the pain can be found. Before that point, your health care provider may not know if you need more testing or further treatment. °HOME CARE INSTRUCTIONS °Monitor your abdominal pain for any changes. The following actions may help to alleviate any discomfort you are experiencing: °· Only take over-the-counter or prescription medicines as directed by your health care provider. °· Do not take laxatives unless directed to do so by your health care provider. °· Try a clear liquid diet (broth, tea, or water) as directed by your health care provider. Slowly move to a bland diet as tolerated. °SEEK MEDICAL CARE IF: °· You have unexplained abdominal pain. °· You have abdominal pain associated with nausea or diarrhea. °· You have pain when you urinate or have a bowel movement. °· You experience abdominal pain that wakes you in the night. °· You have abdominal pain that is worsened or improved by eating food. °· You have abdominal pain that is worsened with eating fatty foods. °· You have a fever. °SEEK IMMEDIATE MEDICAL CARE IF: °· Your pain does not go away within 2 hours. °· You keep throwing up (vomiting). °· Your pain is felt only in portions of the abdomen, such as the right side or the left lower portion of the abdomen. °· You pass bloody or black tarry stools. °MAKE SURE YOU: °· Understand these instructions. °· Will watch your condition. °· Will get help right away if you are not doing well or get worse. °  °This information is not intended to replace advice given to you by your health care provider. Make sure you discuss  any questions you have with your health care provider. °  °Document Released: 06/08/2005 Document Revised: 05/20/2015 Document Reviewed: 05/08/2013 °Elsevier Interactive Patient Education ©2016 [employer] ° °Constipation, Adult °Constipation is when a person has fewer than three bowel movements a week, has difficulty having a bowel movement, or has stools that are dry, hard, or larger than normal. As people grow older, constipation is more common. A low-fiber diet, not taking in enough fluids, and taking certain medicines may make constipation worse.  °CAUSES  °· Certain medicines, such as antidepressants, pain medicine, iron supplements, antacids, and water pills.   °· Certain diseases, such as diabetes, irritable bowel syndrome (IBS), thyroid disease, or depression.   °· Not drinking enough water.   °· Not eating enough fiber-rich foods.   °· Stress or travel.   °· Lack of physical activity or exercise.   °· Ignoring the urge to have a bowel movement.   °· Using laxatives too much.   °SIGNS AND SYMPTOMS  °· Having fewer than three bowel movements a week.   °· Straining to have a bowel movement.   °· Having stools that are hard, dry, or larger than normal.   °· Feeling full or bloated.   °· Pain in the lower abdomen.   °· Not feeling relief after having a bowel movement.   °DIAGNOSIS  °Your health care provider will take a medical history and perform a physical exam. Further testing may be done for severe constipation. Some tests may include: °· A barium enema X-ray to examine your rectum, colon, and, sometimes,   your small intestine.   °· A sigmoidoscopy to examine your lower colon.   °· A colonoscopy to examine your entire colon. °TREATMENT  °Treatment will depend on the severity of your constipation and what is causing it. Some dietary treatments include drinking more fluids and eating more fiber-rich foods. Lifestyle treatments may include regular exercise. If these diet and lifestyle recommendations do not  help, your health care provider may recommend taking over-the-counter laxative medicines to help you have bowel movements. Prescription medicines may be prescribed if over-the-counter medicines do not work.  °HOME CARE INSTRUCTIONS  °· Eat foods that have a lot of fiber, such as fruits, vegetables, whole grains, and beans. °· Limit foods high in fat and processed sugars, such as french fries, hamburgers, cookies, candies, and soda.   °· A fiber supplement may be added to your diet if you cannot get enough fiber from foods.   °· Drink enough fluids to keep your urine clear or pale yellow.   °· Exercise regularly or as directed by your health care provider.   °· Go to the restroom when you have the urge to go. Do not hold it.   °· Only take over-the-counter or prescription medicines as directed by your health care provider. Do not take other medicines for constipation without talking to your health care provider first.   °SEEK IMMEDIATE MEDICAL CARE IF:  °· You have bright red blood in your stool.   °· Your constipation lasts for more than 4 days or gets worse.   °· You have abdominal or rectal pain.   °· You have thin, pencil-like stools.   °· You have unexplained weight loss. °MAKE SURE YOU:  °· Understand these instructions. °· Will watch your condition. °· Will get help right away if you are not doing well or get worse. °  °This information is not intended to replace advice given to you by your health care provider. Make sure you discuss any questions you have with your health care provider. °  °Document Released: 05/27/2004 Document Revised: 09/19/2014 Document Reviewed: 06/10/2013 °Elsevier Interactive Patient Education ©2016 [employer] ° °

## 2015-07-13 NOTE — ED Notes (Signed)
Pt to ed with c/o left lower abd pain x several weeks,  Pt reports difficulty having BM/constipation.  Used laxative yesterday with minimal diarrhea out.

## 2015-07-13 NOTE — ED Provider Notes (Signed)
[facility] Emergency Department Provider Note  ____________________________________________  Time seen: 10:30 AM  I have reviewed the triage vital signs and the nursing notes.   HISTORY  Chief Complaint Abdominal Pain    HPI Roy Watson is a 54 y.o. male who complains of generalized abdominal pain for about 2 weeks. He's had difficulty with bowel movements over this time. As well. He took a Dulcolax yesterday and was able to move a small amount of stool earlier this morning, but no significant relief. No vomiting but does feel like he gets full easily these days. No fevers or chills, remote history of appendectomy     Past Medical History  Diagnosis Date  . Hypertension      There are no active problems to display for this patient.    History reviewed. No pertinent past surgical history. Appendectomy  Current Outpatient Rx  Name  Route  Sig  Dispense  Refill  . cyclobenzaprine (FLEXERIL) 10 MG tablet   Oral   Take 10 mg by mouth 3 (three) times daily as needed for muscle spasms.         Marland Kitchen docusate sodium (COLACE) 100 MG capsule   Oral   Take 2 capsules (200 mg total) by mouth 2 (two) times daily.   120 capsule   0   . gabapentin (NEURONTIN) 100 MG capsule   Oral   Take 100 mg by mouth 3 (three) times daily.         Marland Kitchen HYDROcodone-acetaminophen (NORCO) 10-325 MG per tablet   Oral   Take 1 tablet by mouth every 6 (six) hours as needed.         . metoprolol succinate (TOPROL-XL) 100 MG 24 hr tablet   Oral   Take 100 mg by mouth daily. Take with or immediately following a meal.         . pantoprazole (PROTONIX) 20 MG tablet   Oral   Take 20 mg by mouth daily.         . polyethylene glycol powder (GLYCOLAX/MIRALAX) powder      2 cap fulls in a full glass of water, three times a day, for 5 days.   255 g   0   . senna (SENOKOT) 8.6 MG TABS tablet   Oral   Take 2 tablets (17.2 mg total) by mouth 2 (two) times  daily.   120 each   0   . sildenafil (VIAGRA) 100 MG tablet   Oral   Take 100 mg by mouth daily as needed for erectile dysfunction.         . traMADol (ULTRAM) 50 MG tablet   Oral   Take by mouth every 6 (six) hours as needed.            Allergies Review of patient's allergies indicates no known allergies.   History reviewed. No pertinent family history.  Social History Social History  Substance Use Topics  . Smoking status: Never Smoker   . Smokeless tobacco: None  . Alcohol Use: Yes    Review of Systems  Constitutional:   No fever or chills. No weight changes Eyes:   No blurry vision or double vision.  ENT:   No sore throat. Cardiovascular:   No chest pain. Respiratory:   No dyspnea or cough. Gastrointestinal:   Positive for abdominal pain, without vomiting and diarrhea.  No BRBPR or melena. Genitourinary:   Negative for dysuria, urinary retention, bloody urine, or difficulty urinating. Musculoskeletal:  Negative for back pain. No joint swelling or pain. Skin:   Negative for rash. Neurological:   Negative for headaches, focal weakness or numbness. Psychiatric:  No anxiety or depression.   Endocrine:  No hot/cold intolerance, changes in energy, or sleep difficulty.  10-point ROS otherwise negative.  ____________________________________________   PHYSICAL EXAM:  VITAL SIGNS: ED Triage Vitals  Enc Vitals Group     BP 07/13/15 1100 135/80 mmHg     Pulse Rate 07/13/15 1100 69     Resp --      Temp --      Temp src --      SpO2 07/13/15 1100 96 %     Weight 07/13/15 1016 204 lb (92.534 kg)     Height 07/13/15 1016 5\' 11"  (1.803 m)     Head Cir --      Peak Flow --      Pain Score 07/13/15 1016 7     Pain Loc --      Pain Edu? --      Excl. in ? --      Constitutional:   Alert and oriented. Well appearing and in no distress. Eyes:   No scleral icterus. No conjunctival pallor. PERRL. EOMI ENT   Head:   Normocephalic and atraumatic.    Nose:   No congestion/rhinnorhea. No septal hematoma   Mouth/Throat:   MMM, no pharyngeal erythema. No peritonsillar mass. No uvula shift.   Neck:   No stridor. No SubQ emphysema. No meningismus. Hematological/Lymphatic/Immunilogical:   No cervical lymphadenopathy. Cardiovascular:   RRR. Normal and symmetric distal pulses are present in all extremities. No murmurs, rubs, or gallops. Respiratory:   Normal respiratory effort without tachypnea nor retractions. Breath sounds are clear and equal bilaterally. No wheezes/rales/rhonchi. Gastrointestinal:   Soft with mild generalized tenderness, abdominal fullness on palpation. There is no CVA tenderness.  No rebound, rigidity, or guarding. No pulsatile mass Genitourinary:   deferred Musculoskeletal:   Nontender with normal range of motion in all extremities. No joint effusions.  No lower extremity tenderness.  No edema. Neurologic:   Normal speech and language.  CN 2-10 normal. Motor grossly intact. No pronator drift.  Normal gait. No gross focal neurologic deficits are appreciated.  Skin:    Skin is warm, dry and intact. No rash noted.  No petechiae, purpura, or bullae. Psychiatric:   Mood and affect are normal. Speech and behavior are normal. Patient exhibits appropriate insight and judgment.  ____________________________________________    LABS (pertinent positives/negatives) (all labs ordered are listed, but only abnormal results are displayed) Labs Reviewed  URINALYSIS COMPLETEWITH MICROSCOPIC ([place]) - Abnormal; Notable for the following:    Color, Urine YELLOW (*)    APPearance CLEAR (*)    Squamous Epithelial / LPF 0-5 (*)    All other components within normal limits  LIPASE, BLOOD  COMPREHENSIVE METABOLIC PANEL  CBC   ____________________________________________   EKG    ____________________________________________    RADIOLOGY  X-ray abdomen 2 view reviewed by me interpreted by me, significant stool burden  without evidence of obstruction  ____________________________________________   PROCEDURES   ____________________________________________   INITIAL IMPRESSION / ASSESSMENT AND PLAN / ED COURSE  Pertinent labs & imaging results that were available during my care of the patient were reviewed by me and considered in my medical decision making (see chart for details).  Patient presents with generalized abdominal pain. Exam is nonfocal, vital signs are unremarkable. Labs are unremarkable. X-ray is reassuring and consistent  with constipation. We'll put the patient senna Colace and MiraLAX and have him follow up with primary care.     ____________________________________________   FINAL CLINICAL IMPRESSION(S) / ED DIAGNOSES  Final diagnoses:  Generalized abdominal pain  Constipation, unspecified constipation type      Carrie Mew, MD 07/13/15 1255

## 2015-10-20 ENCOUNTER — Other Ambulatory Visit: Payer: Self-pay | Admitting: Internal Medicine

## 2015-10-20 DIAGNOSIS — R1012 Left upper quadrant pain: Secondary | ICD-10-CM

## 2015-10-20 MED FILL — traMADol HCL 50 MG TABS: 50 | 22 days supply | Qty: 90 | Fill #1

## 2015-10-20 MED FILL — CYCLOBENZAPRINE 10 MG TAB: 10 | 20 days supply | Qty: 60 | Fill #0

## 2015-10-23 ENCOUNTER — Ambulatory Visit (HOSPITAL_COMMUNITY)
Admission: RE | Admit: 2015-10-23 | Discharge: 2015-10-23 | Disposition: A | Discharge status: Home / Self Care | Payer: 59 | Source: Ambulatory Visit | Attending: Internal Medicine | Admitting: Internal Medicine

## 2015-10-23 ENCOUNTER — Ambulatory Visit: Payer: 59

## 2015-10-23 DIAGNOSIS — R1012 Left upper quadrant pain: Secondary | ICD-10-CM | POA: Diagnosis not present

## 2015-10-23 MED ORDER — IOHEXOL 300 MG/ML  SOLN
100.0000 mL | Freq: Once | INTRAMUSCULAR | Status: AC | PRN
  Administered 2015-10-23: 100 mL via INTRAVENOUS

## 2015-11-23 DIAGNOSIS — R12 Heartburn: Secondary | ICD-10-CM | POA: Diagnosis not present

## 2015-11-23 DIAGNOSIS — R1012 Left upper quadrant pain: Secondary | ICD-10-CM | POA: Diagnosis not present

## 2015-12-01 DIAGNOSIS — K2980 Duodenitis without bleeding: Secondary | ICD-10-CM | POA: Diagnosis not present

## 2015-12-01 DIAGNOSIS — R1012 Left upper quadrant pain: Secondary | ICD-10-CM | POA: Diagnosis not present

## 2015-12-01 DIAGNOSIS — R12 Heartburn: Secondary | ICD-10-CM | POA: Diagnosis not present

## 2015-12-01 DIAGNOSIS — K2960 Other gastritis without bleeding: Secondary | ICD-10-CM | POA: Diagnosis not present

## 2015-12-01 DIAGNOSIS — K319 Disease of stomach and duodenum, unspecified: Secondary | ICD-10-CM | POA: Diagnosis not present

## 2015-12-01 DIAGNOSIS — K2950 Unspecified chronic gastritis without bleeding: Secondary | ICD-10-CM | POA: Diagnosis not present

## 2015-12-02 MED FILL — METOPROLOL SUCC ER 50 MG TA: 50 | 90 days supply | Qty: 90 | Fill #1

## 2015-12-03 DIAGNOSIS — R1012 Left upper quadrant pain: Secondary | ICD-10-CM | POA: Diagnosis not present

## 2015-12-03 DIAGNOSIS — R12 Heartburn: Secondary | ICD-10-CM | POA: Diagnosis not present

## 2015-12-08 MED FILL — traMADol HCL 50 MG TABS: 50 | 22 days supply | Qty: 90 | Fill #0

## 2015-12-14 DIAGNOSIS — K5900 Constipation, unspecified: Secondary | ICD-10-CM | POA: Diagnosis not present

## 2015-12-14 DIAGNOSIS — R1032 Left lower quadrant pain: Secondary | ICD-10-CM | POA: Diagnosis not present

## 2015-12-14 DIAGNOSIS — R109 Unspecified abdominal pain: Secondary | ICD-10-CM | POA: Diagnosis not present

## 2015-12-14 DIAGNOSIS — R1012 Left upper quadrant pain: Secondary | ICD-10-CM | POA: Diagnosis not present

## 2015-12-15 DIAGNOSIS — R1032 Left lower quadrant pain: Secondary | ICD-10-CM | POA: Diagnosis not present

## 2015-12-15 DIAGNOSIS — K5900 Constipation, unspecified: Secondary | ICD-10-CM | POA: Diagnosis not present

## 2015-12-21 MED FILL — CYCLOBENZAPRINE 10 MG TAB: 10 | 20 days supply | Qty: 60 | Fill #1

## 2015-12-29 DIAGNOSIS — K635 Polyp of colon: Secondary | ICD-10-CM | POA: Diagnosis not present

## 2015-12-29 DIAGNOSIS — K640 First degree hemorrhoids: Secondary | ICD-10-CM | POA: Diagnosis not present

## 2015-12-29 DIAGNOSIS — D120 Benign neoplasm of cecum: Secondary | ICD-10-CM | POA: Diagnosis not present

## 2015-12-29 DIAGNOSIS — K319 Disease of stomach and duodenum, unspecified: Secondary | ICD-10-CM | POA: Diagnosis not present

## 2015-12-29 DIAGNOSIS — K648 Other hemorrhoids: Secondary | ICD-10-CM | POA: Diagnosis not present

## 2015-12-29 DIAGNOSIS — D123 Benign neoplasm of transverse colon: Secondary | ICD-10-CM | POA: Diagnosis not present

## 2015-12-29 DIAGNOSIS — K5900 Constipation, unspecified: Secondary | ICD-10-CM | POA: Diagnosis not present

## 2015-12-29 DIAGNOSIS — K621 Rectal polyp: Secondary | ICD-10-CM | POA: Diagnosis not present

## 2015-12-29 DIAGNOSIS — K2980 Duodenitis without bleeding: Secondary | ICD-10-CM | POA: Diagnosis not present

## 2015-12-29 DIAGNOSIS — R1032 Left lower quadrant pain: Secondary | ICD-10-CM | POA: Diagnosis not present

## 2015-12-29 DIAGNOSIS — D126 Benign neoplasm of colon, unspecified: Secondary | ICD-10-CM | POA: Diagnosis not present

## 2016-02-05 MED FILL — traMADol HCL 50 MG TABS: 50 | 22 days supply | Qty: 90 | Fill #1

## 2016-02-16 MED FILL — CYCLOBENZAPRINE 10 MG TAB: 10 | 20 days supply | Qty: 60 | Fill #2

## 2016-02-16 MED FILL — PANTOPRAZOLE SOD DR 40 MG T: 40 | 45 days supply | Qty: 90 | Fill #0

## 2016-03-07 MED FILL — METOPROLOL SUCC ER 50 MG TA: 50 | 90 days supply | Qty: 90 | Fill #1

## 2016-03-22 MED FILL — traMADol HCL 50 MG TABS: 50 | 22 days supply | Qty: 90 | Fill #0

## 2016-05-04 MED FILL — traMADol HCL 50 MG TABS: 50 | 22 days supply | Qty: 90 | Fill #1

## 2016-05-04 MED FILL — CYCLOBENZAPRINE 10 MG TAB: 10 | 20 days supply | Qty: 60 | Fill #3

## 2016-05-20 MED FILL — CLOBETASOL 0.05% OINTMENT: 0.05 | 15 days supply | Qty: 60 | Fill #0 | Status: TO

## 2016-06-14 MED FILL — PANTOPRAZOLE SOD DR 40 MG T: 40 | 45 days supply | Qty: 90 | Fill #1

## 2016-06-14 MED FILL — METOPROLOL SUCC ER 50 MG TA: 50 | 90 days supply | Qty: 90 | Fill #0 | Status: TO

## 2016-07-13 MED FILL — CYCLOBENZAPRINE 10 MG TAB: 10 | 20 days supply | Qty: 60 | Fill #4

## 2016-07-14 MED FILL — traMADol HCL 50 MG TABS: 50 | 22 days supply | Qty: 90 | Fill #0 | Status: TO

## 2016-08-09 DIAGNOSIS — M545 Low back pain: Secondary | ICD-10-CM | POA: Diagnosis not present

## 2016-08-09 DIAGNOSIS — E7800 Pure hypercholesterolemia, unspecified: Secondary | ICD-10-CM | POA: Diagnosis not present

## 2016-08-09 DIAGNOSIS — K5909 Other constipation: Secondary | ICD-10-CM | POA: Diagnosis not present

## 2016-08-09 DIAGNOSIS — I10 Essential (primary) hypertension: Secondary | ICD-10-CM | POA: Diagnosis not present

## 2016-08-18 ENCOUNTER — Emergency Department (HOSPITAL_COMMUNITY)
Admission: EM | Admit: 2016-08-18 | Discharge: 2016-08-19 | Disposition: A | Discharge status: Home / Self Care | Payer: 59 | Attending: Emergency Medicine | Admitting: Emergency Medicine

## 2016-08-18 ENCOUNTER — Emergency Department (HOSPITAL_COMMUNITY): Payer: 59

## 2016-08-18 ENCOUNTER — Encounter (HOSPITAL_COMMUNITY): Payer: Self-pay | Admitting: Emergency Medicine

## 2016-08-18 DIAGNOSIS — S299XXA Unspecified injury of thorax, initial encounter: Secondary | ICD-10-CM | POA: Diagnosis not present

## 2016-08-18 DIAGNOSIS — M25551 Pain in right hip: Secondary | ICD-10-CM

## 2016-08-18 DIAGNOSIS — S0091XA Abrasion of unspecified part of head, initial encounter: Secondary | ICD-10-CM | POA: Diagnosis not present

## 2016-08-18 DIAGNOSIS — Z79899 Other long term (current) drug therapy: Secondary | ICD-10-CM | POA: Diagnosis not present

## 2016-08-18 DIAGNOSIS — R079 Chest pain, unspecified: Secondary | ICD-10-CM | POA: Diagnosis not present

## 2016-08-18 DIAGNOSIS — I10 Essential (primary) hypertension: Secondary | ICD-10-CM | POA: Diagnosis not present

## 2016-08-18 DIAGNOSIS — S79911A Unspecified injury of right hip, initial encounter: Secondary | ICD-10-CM | POA: Insufficient documentation

## 2016-08-18 DIAGNOSIS — Y92410 Unspecified street and highway as the place of occurrence of the external cause: Secondary | ICD-10-CM | POA: Insufficient documentation

## 2016-08-18 DIAGNOSIS — Y939 Activity, unspecified: Secondary | ICD-10-CM | POA: Diagnosis not present

## 2016-08-18 DIAGNOSIS — Y999 Unspecified external cause status: Secondary | ICD-10-CM | POA: Insufficient documentation

## 2016-08-18 DIAGNOSIS — S098XXA Other specified injuries of head, initial encounter: Secondary | ICD-10-CM | POA: Diagnosis not present

## 2016-08-18 LAB — COMPREHENSIVE METABOLIC PANEL
ALBUMIN: 4.6 g/dL (ref 3.5–5.0)
ALT: 33 U/L (ref 17–63)
ANION GAP: 12 (ref 5–15)
AST: 29 U/L (ref 15–41)
Alkaline Phosphatase: 91 U/L (ref 38–126)
BUN: 12 mg/dL (ref 6–20)
CHLORIDE: 105 mmol/L (ref 101–111)
CO2: 22 mmol/L (ref 22–32)
Calcium: 9.8 mg/dL (ref 8.9–10.3)
Creatinine, Ser: 0.96 mg/dL (ref 0.61–1.24)
GFR calc Af Amer: >60 mL/min (ref >60)
Glucose, Bld: 88 mg/dL (ref 65–99)
POTASSIUM: 3.6 mmol/L (ref 3.5–5.1)
Sodium: 139 mmol/L (ref 135–145)
Total Bilirubin: 0.5 mg/dL (ref 0.3–1.2)
Total Protein: 7.1 g/dL (ref 6.5–8.1)

## 2016-08-18 LAB — CBC WITH DIFFERENTIAL/PLATELET
BASOS ABS: 0.1 10*3/uL (ref 0.0–0.1)
BASOS PCT: 1 %
EOS PCT: 3 %
Eosinophils Absolute: 0.2 10*3/uL (ref 0.0–0.7)
HCT: 45.6 % (ref 39.0–52.0)
Hemoglobin: 15.7 g/dL (ref 13.0–17.0)
Lymphocytes Relative: 22 %
Lymphs Abs: 1.9 10*3/uL (ref 0.7–4.0)
MCH: 30.8 pg (ref 26.0–34.0)
MCHC: 34.4 g/dL (ref 30.0–36.0)
MCV: 89.4 fL (ref 78.0–100.0)
MONO ABS: 0.7 10*3/uL (ref 0.1–1.0)
Monocytes Relative: 8 %
Neutro Abs: 5.8 10*3/uL (ref 1.7–7.7)
Neutrophils Relative %: 66 %
PLATELETS: 301 10*3/uL (ref 150–400)
RBC: 5.1 MIL/uL (ref 4.22–5.81)
RDW: 12.9 % (ref 11.5–15.5)
WBC: 8.6 10*3/uL (ref 4.0–10.5)

## 2016-08-18 MED ORDER — KETOROLAC TROMETHAMINE 30 MG/ML IJ SOLN
30.0000 mg | Freq: Once | INTRAMUSCULAR | Status: AC
  Administered 2016-08-18: 30 mg via INTRAVENOUS
  Filled 2016-08-18: qty 1

## 2016-08-18 MED ORDER — MORPHINE SULFATE (PF) 4 MG/ML IV SOLN
4.0000 mg | Freq: Once | INTRAVENOUS | Status: AC
  Administered 2016-08-18: 4 mg via INTRAVENOUS
  Filled 2016-08-18: qty 1

## 2016-08-18 MED ORDER — OXYCODONE-ACETAMINOPHEN 5-325 MG PO TABS
1.0000 | ORAL_TABLET | Freq: Once | ORAL | Status: AC
  Administered 2016-08-18: 1 via ORAL
  Filled 2016-08-18: qty 1

## 2016-08-18 NOTE — ED Notes (Signed)
Ambulated patient in hallway with 2 person assist d/t pain. Gait slow, steady.
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
## 2016-08-18 NOTE — ED Notes (Signed)
Patient transported to CT 

## 2016-08-18 NOTE — ED Notes (Signed)
Patient returned to room from x-ray.

## 2016-08-18 NOTE — ED Notes (Signed)
[employer] at bedside 

## 2016-08-19 DIAGNOSIS — Z79899 Other long term (current) drug therapy: Secondary | ICD-10-CM | POA: Diagnosis not present

## 2016-08-19 DIAGNOSIS — I10 Essential (primary) hypertension: Secondary | ICD-10-CM | POA: Diagnosis not present

## 2016-08-19 DIAGNOSIS — S79911A Unspecified injury of right hip, initial encounter: Secondary | ICD-10-CM | POA: Diagnosis not present

## 2016-08-19 DIAGNOSIS — M25551 Pain in right hip: Secondary | ICD-10-CM | POA: Diagnosis not present

## 2016-08-19 MED ORDER — HYDROCODONE-ACETAMINOPHEN 5-325 MG PO TABS: 1.0000 | ORAL_TABLET | ORAL | 0 refills | Status: DC | PRN

## 2016-08-19 MED ORDER — OXYCODONE-ACETAMINOPHEN 5-325 MG PO TABS
1.0000 | ORAL_TABLET | Freq: Once | ORAL | Status: AC
  Administered 2016-08-19: 1 via ORAL
  Filled 2016-08-19: qty 1
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
## 2016-08-21 ENCOUNTER — Emergency Department: Payer: 59

## 2016-08-21 ENCOUNTER — Emergency Department
Admission: EM | Admit: 2016-08-21 | Discharge: 2016-08-21 | Disposition: A | Discharge status: Home / Self Care | Payer: 59 | Attending: Emergency Medicine | Admitting: Emergency Medicine

## 2016-08-21 DIAGNOSIS — R42 Dizziness and giddiness: Secondary | ICD-10-CM | POA: Insufficient documentation

## 2016-08-21 DIAGNOSIS — S0990XA Unspecified injury of head, initial encounter: Secondary | ICD-10-CM | POA: Diagnosis not present

## 2016-08-21 DIAGNOSIS — S06320D Contusion and laceration of left cerebrum without loss of consciousness, subsequent encounter: Secondary | ICD-10-CM | POA: Diagnosis not present

## 2016-08-21 DIAGNOSIS — Z79899 Other long term (current) drug therapy: Secondary | ICD-10-CM | POA: Diagnosis not present

## 2016-08-21 DIAGNOSIS — I10 Essential (primary) hypertension: Secondary | ICD-10-CM | POA: Insufficient documentation

## 2016-08-21 DIAGNOSIS — S06330A Contusion and laceration of cerebrum, unspecified, without loss of consciousness, initial encounter: Secondary | ICD-10-CM | POA: Diagnosis not present

## 2016-08-21 MED ORDER — DIAZEPAM 5 MG PO TABS
5.0000 mg | ORAL_TABLET | Freq: Once | ORAL | Status: AC
  Administered 2016-08-21: 5 mg via ORAL
  Filled 2016-08-21: qty 1

## 2016-08-21 MED ORDER — SODIUM CHLORIDE 0.9 % IV SOLN
Freq: Once | INTRAVENOUS | Status: AC
Start: 2016-08-21 — End: 2016-08-21
  Administered 2016-08-21: 16:00:00 via INTRAVENOUS

## 2016-08-21 MED ORDER — MECLIZINE HCL 25 MG PO TABS
50.0000 mg | ORAL_TABLET | Freq: Once | ORAL | Status: AC
  Administered 2016-08-21: 50 mg via ORAL
  Filled 2016-08-21: qty 2

## 2016-08-21 MED ORDER — MECLIZINE HCL 25 MG PO TABS: 25.0000 mg | ORAL_TABLET | Freq: Three times a day (TID) | ORAL | 1 refills | Status: DC | PRN

## 2016-08-21 MED ORDER — DIAZEPAM 5 MG PO TABS: ORAL_TABLET | ORAL | 0 refills | Status: DC
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
## 2016-08-25 DIAGNOSIS — R42 Dizziness and giddiness: Secondary | ICD-10-CM | POA: Diagnosis not present

## 2016-09-06 ENCOUNTER — Other Ambulatory Visit: Payer: Self-pay | Admitting: Family Medicine

## 2016-09-06 DIAGNOSIS — Z041 Encounter for examination and observation following transport accident: Secondary | ICD-10-CM | POA: Diagnosis not present

## 2016-09-06 DIAGNOSIS — R42 Dizziness and giddiness: Secondary | ICD-10-CM

## 2016-09-08 ENCOUNTER — Ambulatory Visit (HOSPITAL_COMMUNITY)
Admission: RE | Admit: 2016-09-08 | Discharge: 2016-09-08 | Disposition: A | Discharge status: Home / Self Care | Payer: 59 | Source: Ambulatory Visit | Attending: Family Medicine | Admitting: Family Medicine

## 2016-09-08 DIAGNOSIS — R42 Dizziness and giddiness: Secondary | ICD-10-CM | POA: Diagnosis not present

## 2016-09-08 DIAGNOSIS — J349 Unspecified disorder of nose and nasal sinuses: Secondary | ICD-10-CM | POA: Diagnosis not present

## 2016-09-08 DIAGNOSIS — Z041 Encounter for examination and observation following transport accident: Secondary | ICD-10-CM

## 2016-09-08 MED ORDER — GADOBENATE DIMEGLUMINE 529 MG/ML IV SOLN
20.0000 mL | Freq: Once | INTRAVENOUS | Status: AC | PRN
  Administered 2016-09-08: 20 mL via INTRAVENOUS

## 2016-09-11 ENCOUNTER — Ambulatory Visit (HOSPITAL_COMMUNITY): Payer: 59

## 2016-09-15 ENCOUNTER — Encounter: Payer: Self-pay | Admitting: Neurology

## 2016-09-15 ENCOUNTER — Ambulatory Visit (INDEPENDENT_AMBULATORY_CARE_PROVIDER_SITE_OTHER): Payer: 59 | Admitting: Neurology

## 2016-09-15 VITALS — BP 148/84 | HR 76 | Resp 16 | Ht 71.0 in | Wt 215.5 lb

## 2016-09-15 DIAGNOSIS — G44309 Post-traumatic headache, unspecified, not intractable: Secondary | ICD-10-CM | POA: Insufficient documentation

## 2016-09-15 DIAGNOSIS — G4701 Insomnia due to medical condition: Secondary | ICD-10-CM | POA: Diagnosis not present

## 2016-09-15 DIAGNOSIS — H8110 Benign paroxysmal vertigo, unspecified ear: Secondary | ICD-10-CM | POA: Insufficient documentation

## 2016-09-15 DIAGNOSIS — G44301 Post-traumatic headache, unspecified, intractable: Secondary | ICD-10-CM

## 2016-09-15 DIAGNOSIS — M5442 Lumbago with sciatica, left side: Secondary | ICD-10-CM | POA: Diagnosis not present

## 2016-09-15 DIAGNOSIS — G8929 Other chronic pain: Secondary | ICD-10-CM

## 2016-09-15 DIAGNOSIS — F0781 Postconcussional syndrome: Secondary | ICD-10-CM

## 2016-09-15 DIAGNOSIS — M5441 Lumbago with sciatica, right side: Secondary | ICD-10-CM

## 2016-09-15 DIAGNOSIS — G4700 Insomnia, unspecified: Secondary | ICD-10-CM | POA: Insufficient documentation

## 2016-09-15 DIAGNOSIS — M542 Cervicalgia: Secondary | ICD-10-CM | POA: Diagnosis not present

## 2016-09-15 DIAGNOSIS — M545 Low back pain, unspecified: Secondary | ICD-10-CM | POA: Insufficient documentation

## 2016-09-15 DIAGNOSIS — R292 Abnormal reflex: Secondary | ICD-10-CM

## 2016-09-15 DIAGNOSIS — H8113 Benign paroxysmal vertigo, bilateral: Secondary | ICD-10-CM

## 2016-09-15 MED ORDER — PREDNISONE 10 MG (21) PO TBPK: ORAL_TABLET | ORAL | 0 refills | Status: DC

## 2016-09-15 MED ORDER — DIAZEPAM 5 MG PO TABS: 5.0000 mg | ORAL_TABLET | Freq: Every evening | ORAL | 1 refills | Status: DC | PRN

## 2016-09-15 MED ORDER — IMIPRAMINE HCL 25 MG PO TABS: 25.0000 mg | ORAL_TABLET | Freq: Every day | ORAL | 5 refills | Status: DC
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
## 2016-09-16 ENCOUNTER — Telehealth: Payer: Self-pay | Admitting: Neurology

## 2016-09-16 ENCOUNTER — Ambulatory Visit: Payer: 59

## 2016-09-16 NOTE — Telephone Encounter (Signed)
I have spoken with [name] this morning.  Diazepam rx. was faxed to [facility] yesterday, with fax confirmation received.  I have faxed it again to [facility] today, fax# [phone]/fim

## 2016-09-16 NOTE — Telephone Encounter (Signed)
I was calling the patient to scheduling the MRI but he informed me that he wants to have it at [facility] and that he was going to call over there to schedule it.. But he also informed me that Dr. Felecia Shelling was going to proscribe Valium and he said it never went to his pharmacy. The best number to contact the patient is [phone]

## 2016-09-22 ENCOUNTER — Ambulatory Visit
Admission: RE | Admit: 2016-09-22 | Discharge: 2016-09-22 | Disposition: A | Discharge status: Home / Self Care | Payer: 59 | Source: Ambulatory Visit | Attending: Neurology | Admitting: Neurology

## 2016-09-22 DIAGNOSIS — M5124 Other intervertebral disc displacement, thoracic region: Secondary | ICD-10-CM | POA: Insufficient documentation

## 2016-09-22 DIAGNOSIS — R292 Abnormal reflex: Secondary | ICD-10-CM | POA: Insufficient documentation

## 2016-09-22 DIAGNOSIS — M50322 Other cervical disc degeneration at C5-C6 level: Secondary | ICD-10-CM | POA: Diagnosis not present

## 2016-09-22 DIAGNOSIS — J392 Other diseases of pharynx: Secondary | ICD-10-CM | POA: Insufficient documentation

## 2016-09-22 DIAGNOSIS — M542 Cervicalgia: Secondary | ICD-10-CM | POA: Insufficient documentation

## 2016-09-22 DIAGNOSIS — S199XXA Unspecified injury of neck, initial encounter: Secondary | ICD-10-CM | POA: Diagnosis not present

## 2016-09-26 ENCOUNTER — Telehealth: Payer: Self-pay | Admitting: *Deleted

## 2016-09-26 NOTE — Telephone Encounter (Signed)
LMTC./fim 

## 2016-09-26 NOTE — Telephone Encounter (Signed)
-----   Message from Britt Bottom, MD sent at 09/22/2016  1:41 PM EST ----- Please let him know that the MRI of the cervical spine show that the spinal cord was normal. He does have some degenerative disc changes at C5-C6 and C6-C7 but nothing bad enough to refer to surgery.

## 2016-09-26 NOTE — Telephone Encounter (Signed)
I have spoken with Roy Watson this afternoon, and per RAS, explained that MRI c-spine showed degen. changes, but nothing bad enough to refer to NS for right now.  He verbalized understanding of same/fim

## 2016-10-03 MED FILL — PANTOPRAZOLE SOD DR 40 MG T: 40 | 45 days supply | Qty: 90 | Fill #2

## 2016-10-04 MED FILL — CYCLOBENZAPRINE 10 MG TAB: 10 | 20 days supply | Qty: 60 | Fill #0

## 2016-10-25 MED FILL — traMADol HCL 50 MG TABS: 50 | 22 days supply | Qty: 90 | Fill #0

## 2016-10-27 ENCOUNTER — Encounter: Payer: Self-pay | Admitting: Neurology

## 2016-10-27 ENCOUNTER — Ambulatory Visit (INDEPENDENT_AMBULATORY_CARE_PROVIDER_SITE_OTHER): Payer: 59 | Admitting: Neurology

## 2016-10-27 VITALS — BP 127/90 | HR 70 | Resp 14 | Ht 71.0 in | Wt 213.0 lb

## 2016-10-27 DIAGNOSIS — G44309 Post-traumatic headache, unspecified, not intractable: Secondary | ICD-10-CM

## 2016-10-27 DIAGNOSIS — G4701 Insomnia due to medical condition: Secondary | ICD-10-CM

## 2016-10-27 DIAGNOSIS — H8113 Benign paroxysmal vertigo, bilateral: Secondary | ICD-10-CM

## 2016-10-27 DIAGNOSIS — F0781 Postconcussional syndrome: Secondary | ICD-10-CM

## 2016-10-27 DIAGNOSIS — H8112 Benign paroxysmal vertigo, left ear: Secondary | ICD-10-CM | POA: Diagnosis not present

## 2016-10-27 MED ORDER — CYCLOBENZAPRINE HCL 10 MG PO TABS: 10.0000 mg | ORAL_TABLET | Freq: Every day | ORAL | 5 refills | Status: DC

## 2016-10-27 NOTE — Progress Notes (Signed)
[facility]  PATIENT: Roy Watson DOB: 1961/06/24  REFERRING DOCTOR OR PCP:  Orvilla Fus Jefm Bryant) SOURCE: patient, records from Dr. Deloria Lair, imaging reports.  Images on PACS  _________________________________   HISTORICAL  CHIEF COMPLAINT:  Chief Complaint  Patient presents with  . Postconcussive Syndrom    Sts. dizziness is much improved since Epley maneuver at last ov.  Sts. now just has dizziness with looking up or down, turning over in bed.  If he closes his eyes in the shower he has difficulty with balance. Sts. h/a's are less frequent and less severe/fim    HISTORY OF PRESENT ILLNESS:  Roy Watson Is a 56 year old man I first saw her for postconcussive syndrome with vertigo, and mild cognitive issues, and headache.  Vertigo:  He continues to have dizziness with looking up or bending down and getting back up or rolling over in bed.   At times, he still feels his eyes jerking around But both the time he doesn't..    Overall he feels 70% better.   He is doing Brandt-Daroff exercises daily.   No N/V with the vertigo.   He denies any change in hearing.     Headache:   These are much better.   They now are shorter and less frequent with less intensity. When present, the headache is over the eyes. Moving can increase the pain.   Sometimes laying down will trigger a mild headache.   He tolerates the imipramine at night well.     Cognitive changes:   He feels his cognition is back to normal.   He is back to work and not having any major problem.      Insomnia:   He has trouble falling asleep and then usually wakes up several times at night.   He tries to go to bed at 930 and doesn't fall asleep until 11-12.     I have reviewed the MRI of the brain dated 09/08/2016.  Today I personally reviewed the MRI of the cervical spine performed 09/22/2016. This shows some mild degenerative changes at C5-C6 and C6-C7 but no definite nerve root compression. There is  no impingement on the spinal cord.     The MRI of the brain shows the sinusitis but the brain itself appears normal.  REVIEW OF SYSTEMS: Constitutional: No fevers, chills, sweats, or change in appetite.   He has insomnia Eyes: No visual changes, double vision, eye pain Ear, nose and throat: No hearing loss, ear pain, nasal congestion, sore throat.  Has vertigo Cardiovascular: No chest pain, palpitations Respiratory: No shortness of breath at rest or with exertion.   No wheezes GastrointestinaI: No nausea, vomiting, diarrhea, abdominal pain, fecal incontinence Genitourinary: No dysuria, urinary retention or frequency.  No nocturia. Musculoskeletal: Notes neck pain, back pain Integumentary: No rash, pruritus, skin lesions Neurological: as above Psychiatric: No depression at this time.  No anxiety Endocrine: No palpitations, diaphoresis, change in appetite, change in weigh or increased thirst Hematologic/Lymphatic: No anemia, purpura, petechiae. Allergic/Immunologic: No itchy/runny eyes, nasal congestion, recent allergic reactions, rashes  ALLERGIES: No Known Allergies  HOME MEDICATIONS:  Current Outpatient Prescriptions:  .  CIALIS 20 MG tablet, , Disp: , Rfl: 1 .  cyclobenzaprine (FLEXERIL) 10 MG tablet, Take 10 mg by mouth 3 (three) times daily as needed for muscle spasms., Disp: , Rfl:  .  diazepam (VALIUM) 5 MG tablet, Take 1 tablet (5 mg total) by mouth at bedtime as needed., Disp: 30 tablet, Rfl: 1 .  docusate sodium (COLACE) 100 MG capsule, Take 2 capsules (200 mg total) by mouth 2 (two) times daily., Disp: 120 capsule, Rfl: 0 .  HYDROcodone-acetaminophen (NORCO/VICODIN) 5-325 MG tablet, Take 1-2 tablets by mouth every 4 (four) hours as needed for moderate pain., Disp: 20 tablet, Rfl: 0 .  ibuprofen (ADVIL,MOTRIN) 200 MG tablet, Take 800 mg by mouth every 6 (six) hours as needed., Disp: , Rfl:  .  imipramine (TOFRANIL) 25 MG tablet, Take 1 tablet (25 mg total) by mouth at  bedtime., Disp: 30 tablet, Rfl: 5 .  lactulose (CHRONULAC) 10 GM/15ML solution, Take 30 g by mouth 2 (two) times daily., Disp: , Rfl:  .  meclizine (ANTIVERT) 25 MG tablet, Take 1 tablet (25 mg total) by mouth 3 (three) times daily as needed for dizziness or nausea., Disp: 30 tablet, Rfl: 1 .  metoprolol succinate (TOPROL-XL) 100 MG 24 hr tablet, Take 100 mg by mouth daily. Take with or immediately following a meal., Disp: , Rfl:  .  pantoprazole (PROTONIX) 20 MG tablet, Take 20 mg by mouth daily., Disp: , Rfl:  .  polyethylene glycol powder (GLYCOLAX/MIRALAX) powder, 2 cap fulls in a full glass of water, three times a day, for 5 days., Disp: 255 g, Rfl: 0 .  predniSONE (STERAPRED UNI-PAK 21 TAB) 10 MG (21) TBPK tablet, Take over 6 days as directed, Disp: 21 tablet, Rfl: 0 .  senna (SENOKOT) 8.6 MG TABS tablet, Take 2 tablets (17.2 mg total) by mouth 2 (two) times daily., Disp: 120 each, Rfl: 0 .  traMADol (ULTRAM) 50 MG tablet, Take 50 mg by mouth every 6 (six) hours as needed for moderate pain. , Disp: , Rfl:   PAST MEDICAL HISTORY: Past Medical History:  Diagnosis Date  . Hypertension     PAST SURGICAL HISTORY: Past Surgical History:  Procedure Laterality Date  . APPENDECTOMY    . BACK SURGERY    . KNEE ARTHROPLASTY Left     FAMILY HISTORY: Family History  Problem Relation Age of Onset  . Hypertension Mother   . Transient ischemic attack Mother   . COPD Father   . Hypertension Father   . Diverticulitis Father     SOCIAL HISTORY:  Social History   Social History  . Marital status: Married    Spouse name: N/A  . Number of children: N/A  . Years of education: N/A   Occupational History  . Not on file.   Social History Main Topics  . Smoking status: Never Smoker  . Smokeless tobacco: Never Used  . Alcohol use Yes  . Drug use: No  . Sexual activity: Not on file   Other Topics Concern  . Not on file   Social History Narrative  . No narrative on file      PHYSICAL EXAM  Vitals:   10/27/16 0824  BP: 127/90  Pulse: 70  Resp: 14  Weight: 213 lb (96.6 kg)  Height: 5\' 11"  (1.803 m)    Body mass index is 29.71 kg/m.   General: The patient is well-developed and well-nourished and in no acute distress  Neck: The neck is supple but now nontender on palpation   Neurologic Exam  Mental status: The patient is alert and oriented x 3 at the time of the examination. The patient has apparent normal recent and remote memory, with an apparently normal attention span and concentration ability.   Speech is normal.  Cranial nerves: Extraocular movements are full.   Facial symmetry is present. There  is good facial sensation to soft touch bilaterally.Facial strength is normal.  Trapezius and sternocleidomastoid strength is normal. No dysarthria is noted.  The tongue is midline, and the patient has symmetric elevation of the soft palate. No obvious hearing deficits are noted.  Motor:  Muscle bulk is normal.   Tone is normal. Strength is  5 / 5 in all 4 extremities.   Sensory: Sensory testing is intact to pinprick, soft touch and vibration sensation in all 4 extremities.   Gait and station: Station is normal.   Gait is normal.     Maneuvers:  Barany maneuver to the left did not evoke vertigo as it did last visit    DIAGNOSTIC DATA (LABS, IMAGING, TESTING) - I reviewed patient records, labs, notes, testing and imaging myself where available.  Lab Results  Component Value Date   WBC 8.6 08/18/2016   HGB 15.7 08/18/2016   HCT 45.6 08/18/2016   MCV 89.4 08/18/2016   PLT 301 08/18/2016      Component Value Date/Time   NA 139 08/18/2016 1833   K 3.6 08/18/2016 1833   CL 105 08/18/2016 1833   CO2 22 08/18/2016 1833   GLUCOSE 88 08/18/2016 1833   BUN 12 08/18/2016 1833   CREATININE 0.96 08/18/2016 1833   CALCIUM 9.8 08/18/2016 1833   PROT 7.1 08/18/2016 1833   ALBUMIN 4.6 08/18/2016 1833   AST 29 08/18/2016 1833   ALT 33 08/18/2016  1833   ALKPHOS 91 08/18/2016 1833   BILITOT 0.5 08/18/2016 1833   GFRNONAA >60 08/18/2016 1833   GFRAA >60 08/18/2016 1833       ASSESSMENT AND PLAN  Postconcussion syndrome  Benign paroxysmal positional vertigo due to bilateral vestibular disorder  Insomnia due to medical condition  Post-traumatic headache, not intractable, unspecified chronicity pattern   1.   Epley maneuver today.     If not better, he can continue Brandt-Daroff exercises at home.  2.   He spends a lot of time at night trying to fall asleep which can get frustrating.. Add Flexeril at bedtime with melatonin at 9:30 pm and go to bed at 10:30  3.   He will return to see me as needed if there are new or worsening neurologic symptoms.  Richard A. Felecia Shelling, MD, PhD [id number], 123456 AM Certified in Neurology, Clinical Neurophysiology, Sleep Medicine, Pain Medicine and Neuroimaging  [facility] [address] [place], [place] [address] [phone]

## 2016-11-17 DIAGNOSIS — H524 Presbyopia: Secondary | ICD-10-CM | POA: Diagnosis not present

## 2016-12-02 MED FILL — CYCLOBENZAPRINE 10 MG TAB: 10 | 20 days supply | Qty: 60 | Fill #1

## 2016-12-15 MED FILL — LACTULOSE 10 GM/15 ML SOLN: 10 | 30 days supply | Qty: 900 | Fill #0

## 2016-12-26 MED FILL — METOPROLOL SUCC ER 50 MG TA: 50 | 90 days supply | Qty: 90 | Fill #0

## 2016-12-27 MED FILL — traMADol HCL 50 MG TABS: 50 | 22 days supply | Qty: 90 | Fill #0

## 2017-01-19 MED FILL — LACTULOSE 10 GM/15 ML SOLN: 10 | 30 days supply | Qty: 900 | Fill #1

## 2017-01-19 MED FILL — PANTOPRAZOLE SOD DR 40 MG T: 40 | 45 days supply | Qty: 90 | Fill #0

## 2017-02-02 MED FILL — CYCLOBENZAPRINE 10 MG TAB: 10 | 20 days supply | Qty: 60 | Fill #2

## 2017-02-08 MED FILL — traMADol HCL 50 MG TABS: 50 | 90 days supply | Qty: 90 | Fill #0

## 2017-02-20 DIAGNOSIS — R1012 Left upper quadrant pain: Secondary | ICD-10-CM | POA: Diagnosis not present

## 2017-02-20 DIAGNOSIS — K5900 Constipation, unspecified: Secondary | ICD-10-CM | POA: Diagnosis not present

## 2017-02-20 DIAGNOSIS — R12 Heartburn: Secondary | ICD-10-CM | POA: Diagnosis not present

## 2017-02-20 MED FILL — LINZESS 145 MCG CAPSULE: 145 | 30 days supply | Qty: 30 | Fill #0

## 2017-04-03 MED FILL — LINZESS 145 MCG CAPSULE: 145 | 30 days supply | Qty: 30 | Fill #0

## 2017-04-05 MED FILL — METOPROLOL SUCC ER 50 MG TA: 50 | 90 days supply | Qty: 90 | Fill #0

## 2017-05-08 MED FILL — CYCLOBENZAPRINE 10 MG TABLE: 10 | 20 days supply | Qty: 60 | Fill #3

## 2017-05-08 MED FILL — LACTULOSE 10 GM/15 ML SOLN: 10 | 30 days supply | Qty: 900 | Fill #2

## 2017-05-10 ENCOUNTER — Other Ambulatory Visit: Payer: Self-pay | Admitting: Nurse Practitioner

## 2017-05-10 DIAGNOSIS — R140 Abdominal distension (gaseous): Secondary | ICD-10-CM | POA: Diagnosis not present

## 2017-05-10 DIAGNOSIS — R1032 Left lower quadrant pain: Secondary | ICD-10-CM | POA: Diagnosis not present

## 2017-05-10 DIAGNOSIS — R1012 Left upper quadrant pain: Secondary | ICD-10-CM

## 2017-05-10 DIAGNOSIS — K5900 Constipation, unspecified: Secondary | ICD-10-CM | POA: Diagnosis not present

## 2017-05-10 DIAGNOSIS — R1013 Epigastric pain: Secondary | ICD-10-CM

## 2017-05-12 ENCOUNTER — Ambulatory Visit: Payer: 59

## 2017-05-18 ENCOUNTER — Ambulatory Visit (HOSPITAL_COMMUNITY)
Admission: RE | Admit: 2017-05-18 | Discharge: 2017-05-18 | Disposition: A | Discharge status: Home / Self Care | Payer: 59 | Source: Ambulatory Visit | Attending: Nurse Practitioner | Admitting: Nurse Practitioner

## 2017-05-18 DIAGNOSIS — K760 Fatty (change of) liver, not elsewhere classified: Secondary | ICD-10-CM | POA: Diagnosis not present

## 2017-05-18 DIAGNOSIS — R140 Abdominal distension (gaseous): Secondary | ICD-10-CM | POA: Diagnosis not present

## 2017-05-18 DIAGNOSIS — R1012 Left upper quadrant pain: Secondary | ICD-10-CM | POA: Insufficient documentation

## 2017-05-18 DIAGNOSIS — R1013 Epigastric pain: Secondary | ICD-10-CM | POA: Insufficient documentation

## 2017-05-18 DIAGNOSIS — K5900 Constipation, unspecified: Secondary | ICD-10-CM | POA: Diagnosis not present

## 2017-05-24 DIAGNOSIS — K2990 Gastroduodenitis, unspecified, without bleeding: Secondary | ICD-10-CM | POA: Diagnosis not present

## 2017-05-24 DIAGNOSIS — R1032 Left lower quadrant pain: Secondary | ICD-10-CM | POA: Diagnosis not present

## 2017-05-24 DIAGNOSIS — R1012 Left upper quadrant pain: Secondary | ICD-10-CM | POA: Diagnosis not present

## 2017-05-29 MED FILL — traMADol HCL 50 MG TABS: 50 | 22 days supply | Qty: 90 | Fill #0

## 2017-06-02 MED FILL — PANTOPRAZOLE SOD DR 40 MG T: 40 | 90 days supply | Qty: 90 | Fill #0

## 2017-06-26 DIAGNOSIS — R1013 Epigastric pain: Secondary | ICD-10-CM | POA: Diagnosis not present

## 2017-06-26 DIAGNOSIS — K760 Fatty (change of) liver, not elsewhere classified: Secondary | ICD-10-CM | POA: Diagnosis not present

## 2017-06-26 DIAGNOSIS — K5900 Constipation, unspecified: Secondary | ICD-10-CM | POA: Diagnosis not present

## 2017-06-27 DIAGNOSIS — K5900 Constipation, unspecified: Secondary | ICD-10-CM | POA: Diagnosis not present

## 2017-06-27 DIAGNOSIS — K760 Fatty (change of) liver, not elsewhere classified: Secondary | ICD-10-CM | POA: Diagnosis not present

## 2017-06-27 DIAGNOSIS — R1013 Epigastric pain: Secondary | ICD-10-CM | POA: Diagnosis not present

## 2017-06-28 MED FILL — LACTULOSE 10 GM/15 ML SOLN: 10 | 30 days supply | Qty: 900 | Fill #3

## 2017-07-05 MED FILL — METOPROLOL SUCC ER 50 MG TA: 50 | 30 days supply | Qty: 30 | Fill #0

## 2017-07-21 DIAGNOSIS — I10 Essential (primary) hypertension: Secondary | ICD-10-CM | POA: Diagnosis not present

## 2017-07-21 DIAGNOSIS — E7800 Pure hypercholesterolemia, unspecified: Secondary | ICD-10-CM | POA: Diagnosis not present

## 2017-07-21 DIAGNOSIS — R1013 Epigastric pain: Secondary | ICD-10-CM | POA: Diagnosis not present

## 2017-07-21 DIAGNOSIS — K760 Fatty (change of) liver, not elsewhere classified: Secondary | ICD-10-CM | POA: Diagnosis not present

## 2017-07-31 MED FILL — CYCLOBENZAPRINE 10 MG TAB: 10 | 20 days supply | Qty: 60 | Fill #4

## 2017-08-15 MED FILL — CLOBETASOL 0.05% OINTMENT: 0.05 | 15 days supply | Qty: 60 | Fill #0

## 2017-08-15 MED FILL — METOPROLOL SUCC ER 50 MG TA: 50 | 90 days supply | Qty: 90 | Fill #0

## 2017-08-24 MED FILL — traMADol HCL 50 MG TABS: 50 | 22 days supply | Qty: 90 | Fill #0

## 2017-09-15 MED FILL — PANTOPRAZOLE SOD DR 40 MG T: 40 | 90 days supply | Qty: 90 | Fill #1

## 2017-09-18 MED FILL — LACTULOSE 10 GM/15 ML SOLN: 10 | 90 days supply | Qty: 2700 | Fill #0

## 2017-10-11 MED FILL — CYCLOBENZAPRINE 10 MG TAB: 10 | 20 days supply | Qty: 60 | Fill #0

## 2017-11-17 MED FILL — METOPROLOL SUCC ER 50 MG TA: 50 | 90 days supply | Qty: 90 | Fill #1

## 2017-11-24 MED FILL — traMADol HCL 50 MG TABS: 50 | 23 days supply | Qty: 90 | Fill #0

## 2017-12-01 DIAGNOSIS — H524 Presbyopia: Secondary | ICD-10-CM | POA: Diagnosis not present

## 2017-12-07 DIAGNOSIS — H04123 Dry eye syndrome of bilateral lacrimal glands: Secondary | ICD-10-CM | POA: Diagnosis not present

## 2017-12-07 DIAGNOSIS — H43813 Vitreous degeneration, bilateral: Secondary | ICD-10-CM | POA: Diagnosis not present

## 2017-12-15 MED FILL — LACTULOSE 10 GM/15 ML SOLN: 10 | 63 days supply | Qty: 1892 | Fill #1

## 2017-12-19 MED FILL — PANTOPRAZOLE SOD DR 40 MG T: 40 | 90 days supply | Qty: 90 | Fill #2

## 2017-12-19 MED FILL — CYCLOBENZAPRINE 10 MG TAB: 10 | 20 days supply | Qty: 60 | Fill #1
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
## 2018-02-15 MED FILL — CYCLOBENZAPRINE 10 MG TAB: 10 | 20 days supply | Qty: 60 | Fill #2

## 2018-02-15 MED FILL — METOPROLOL SUCCINATE ER 50: 50 | 90 days supply | Qty: 90 | Fill #2

## 2018-02-15 MED FILL — traMADol HCL 50 MG TABS: 50 | 23 days supply | Qty: 90 | Fill #0

## 2018-03-20 DIAGNOSIS — S299XXA Unspecified injury of thorax, initial encounter: Secondary | ICD-10-CM | POA: Diagnosis not present

## 2018-03-20 DIAGNOSIS — S2231XA Fracture of one rib, right side, initial encounter for closed fracture: Secondary | ICD-10-CM | POA: Diagnosis not present

## 2018-03-20 DIAGNOSIS — R002 Palpitations: Secondary | ICD-10-CM | POA: Diagnosis not present

## 2018-03-20 DIAGNOSIS — R0781 Pleurodynia: Secondary | ICD-10-CM | POA: Diagnosis not present

## 2018-03-20 DIAGNOSIS — W01198A Fall on same level from slipping, tripping and stumbling with subsequent striking against other object, initial encounter: Secondary | ICD-10-CM | POA: Diagnosis not present

## 2018-03-26 MED FILL — PANTOPRAZOLE SOD DR 40 MG T: 40 | 90 days supply | Qty: 90 | Fill #0

## 2018-03-28 ENCOUNTER — Other Ambulatory Visit: Payer: Self-pay | Admitting: Nurse Practitioner

## 2018-03-28 DIAGNOSIS — R079 Chest pain, unspecified: Secondary | ICD-10-CM

## 2018-03-28 DIAGNOSIS — E7800 Pure hypercholesterolemia, unspecified: Secondary | ICD-10-CM | POA: Diagnosis not present

## 2018-03-28 DIAGNOSIS — R002 Palpitations: Secondary | ICD-10-CM

## 2018-03-28 DIAGNOSIS — I10 Essential (primary) hypertension: Secondary | ICD-10-CM | POA: Diagnosis not present

## 2018-04-02 DIAGNOSIS — R002 Palpitations: Secondary | ICD-10-CM | POA: Diagnosis not present

## 2018-04-04 ENCOUNTER — Other Ambulatory Visit: Payer: Self-pay

## 2018-04-05 ENCOUNTER — Ambulatory Visit
Admission: RE | Admit: 2018-04-05 | Discharge: 2018-04-05 | Disposition: A | Discharge status: Home / Self Care | Payer: 59 | Source: Ambulatory Visit | Attending: Nurse Practitioner | Admitting: Nurse Practitioner

## 2018-04-05 DIAGNOSIS — R002 Palpitations: Secondary | ICD-10-CM | POA: Insufficient documentation

## 2018-04-05 DIAGNOSIS — I10 Essential (primary) hypertension: Secondary | ICD-10-CM | POA: Diagnosis not present

## 2018-04-05 DIAGNOSIS — R079 Chest pain, unspecified: Secondary | ICD-10-CM | POA: Insufficient documentation

## 2018-04-05 LAB — NM MYOCAR MULTI W/SPECT W/WALL MOTION / EF
CSEPEDS: 0 s
CSEPHR: 73 %
Estimated workload: 8.5 METS
Exercise duration (min): 7 min
LV dias vol: 81 mL (ref 62–150)
LVSYSVOL: 29 mL
MPHR: 164 {beats}/min
Peak HR: 121 {beats}/min
Rest HR: 67 {beats}/min
SDS: 0
SRS: 2
SSS: 4
TID: 1.05

## 2018-04-05 MED ORDER — TECHNETIUM TC 99M TETROFOSMIN IV KIT
13.0200 | PACK | Freq: Once | INTRAVENOUS | Status: AC | PRN
  Administered 2018-04-05: 13.02 via INTRAVENOUS

## 2018-04-05 MED ORDER — TECHNETIUM TC 99M TETROFOSMIN IV KIT
32.9700 | PACK | Freq: Once | INTRAVENOUS | Status: AC | PRN
  Administered 2018-04-05: 32.97 via INTRAVENOUS

## 2018-04-05 NOTE — Progress Notes (Signed)
*  PRELIMINARY RESULTS* Echocardiogram 2D Echocardiogram has been performed.  Roy Watson 04/05/2018, 10:25 AM

## 2018-04-25 MED FILL — LACTULOSE 10 GM/15 ML SOLUT: 10 | 63 days supply | Qty: 1892 | Fill #2

## 2018-05-07 MED FILL — traMADol HCL 50 MG TABS: 50 | 22 days supply | Qty: 90 | Fill #0

## 2018-05-07 MED FILL — CLOBETASOL 0.05% OINTMENT: 0.05 | 20 days supply | Qty: 60 | Fill #0

## 2018-05-07 MED FILL — CYCLOBENZAPRINE HCL 10 MG T: 10 | 20 days supply | Qty: 60 | Fill #3

## 2018-05-18 MED FILL — METOPROLOL SUCCINATE ER 50: 50 | 90 days supply | Qty: 90 | Fill #3

## 2018-06-07 DIAGNOSIS — K5900 Constipation, unspecified: Secondary | ICD-10-CM | POA: Diagnosis not present

## 2018-06-08 DIAGNOSIS — I10 Essential (primary) hypertension: Secondary | ICD-10-CM | POA: Diagnosis not present

## 2018-06-08 DIAGNOSIS — E7800 Pure hypercholesterolemia, unspecified: Secondary | ICD-10-CM | POA: Diagnosis not present

## 2018-06-08 DIAGNOSIS — R0602 Shortness of breath: Secondary | ICD-10-CM | POA: Diagnosis not present

## 2018-06-22 MED FILL — PANTOPRAZOLE SOD DR 40 MG T: 40 | 90 days supply | Qty: 90 | Fill #0

## 2018-07-03 MED FILL — CYCLOBENZAPRINE HCL 10 MG T: 10 | 20 days supply | Qty: 60 | Fill #4

## 2018-07-05 ENCOUNTER — Other Ambulatory Visit: Payer: Self-pay | Admitting: Nurse Practitioner

## 2018-07-05 DIAGNOSIS — R1012 Left upper quadrant pain: Secondary | ICD-10-CM | POA: Diagnosis not present

## 2018-07-05 DIAGNOSIS — K5900 Constipation, unspecified: Secondary | ICD-10-CM | POA: Diagnosis not present

## 2018-07-05 DIAGNOSIS — R1013 Epigastric pain: Secondary | ICD-10-CM | POA: Diagnosis not present

## 2018-07-05 DIAGNOSIS — Z86010 Personal history of colonic polyps: Secondary | ICD-10-CM | POA: Diagnosis not present

## 2018-07-06 MED FILL — traMADol HCL 50 MG TABS: 50 | 22 days supply | Qty: 90 | Fill #0

## 2018-07-23 DIAGNOSIS — E7800 Pure hypercholesterolemia, unspecified: Secondary | ICD-10-CM | POA: Diagnosis not present

## 2018-07-23 DIAGNOSIS — Z0000 Encounter for general adult medical examination without abnormal findings: Secondary | ICD-10-CM | POA: Diagnosis not present

## 2018-07-23 DIAGNOSIS — I10 Essential (primary) hypertension: Secondary | ICD-10-CM | POA: Diagnosis not present

## 2018-07-23 DIAGNOSIS — K760 Fatty (change of) liver, not elsewhere classified: Secondary | ICD-10-CM | POA: Diagnosis not present

## 2018-07-24 ENCOUNTER — Ambulatory Visit
Admission: RE | Admit: 2018-07-24 | Discharge: 2018-07-24 | Disposition: A | Discharge status: Home / Self Care | Payer: 59 | Source: Ambulatory Visit | Attending: Nurse Practitioner | Admitting: Nurse Practitioner

## 2018-07-24 DIAGNOSIS — K5900 Constipation, unspecified: Secondary | ICD-10-CM | POA: Insufficient documentation

## 2018-07-24 DIAGNOSIS — R1012 Left upper quadrant pain: Secondary | ICD-10-CM | POA: Diagnosis not present

## 2018-07-24 DIAGNOSIS — M5136 Other intervertebral disc degeneration, lumbar region: Secondary | ICD-10-CM | POA: Diagnosis not present

## 2018-07-24 DIAGNOSIS — N281 Cyst of kidney, acquired: Secondary | ICD-10-CM | POA: Diagnosis not present

## 2018-07-24 LAB — POCT I-STAT CREATININE: Creatinine, Ser: 1 mg/dL (ref 0.61–1.24)

## 2018-07-24 MED ORDER — IOPAMIDOL (ISOVUE-300) INJECTION 61%
100.0000 mL | Freq: Once | INTRAVENOUS | Status: AC | PRN
  Administered 2018-07-24: 100 mL via INTRAVENOUS

## 2018-07-31 DIAGNOSIS — I10 Essential (primary) hypertension: Secondary | ICD-10-CM | POA: Diagnosis not present

## 2018-07-31 DIAGNOSIS — K3189 Other diseases of stomach and duodenum: Secondary | ICD-10-CM | POA: Diagnosis not present

## 2018-07-31 DIAGNOSIS — K2970 Gastritis, unspecified, without bleeding: Secondary | ICD-10-CM | POA: Diagnosis not present

## 2018-07-31 DIAGNOSIS — Z86010 Personal history of colonic polyps: Secondary | ICD-10-CM | POA: Diagnosis not present

## 2018-07-31 DIAGNOSIS — R1012 Left upper quadrant pain: Secondary | ICD-10-CM | POA: Diagnosis not present

## 2018-07-31 DIAGNOSIS — K210 Gastro-esophageal reflux disease with esophagitis: Secondary | ICD-10-CM | POA: Diagnosis not present

## 2018-07-31 DIAGNOSIS — K2950 Unspecified chronic gastritis without bleeding: Secondary | ICD-10-CM | POA: Diagnosis not present

## 2018-07-31 DIAGNOSIS — K640 First degree hemorrhoids: Secondary | ICD-10-CM | POA: Diagnosis not present

## 2018-08-16 DIAGNOSIS — K760 Fatty (change of) liver, not elsewhere classified: Secondary | ICD-10-CM | POA: Diagnosis not present

## 2018-08-16 DIAGNOSIS — K5900 Constipation, unspecified: Secondary | ICD-10-CM | POA: Diagnosis not present

## 2018-08-16 DIAGNOSIS — Z789 Other specified health status: Secondary | ICD-10-CM | POA: Diagnosis not present

## 2018-08-27 DIAGNOSIS — M7711 Lateral epicondylitis, right elbow: Secondary | ICD-10-CM | POA: Diagnosis not present

## 2018-08-27 DIAGNOSIS — M7701 Medial epicondylitis, right elbow: Secondary | ICD-10-CM | POA: Diagnosis not present

## 2018-09-06 MED FILL — PANTOPRAZOLE SOD DR 40 MG T: 40 | 30 days supply | Qty: 60 | Fill #0

## 2018-09-10 MED FILL — CYCLOBENZAPRINE HCL 10 MG T: 10 | 20 days supply | Qty: 60 | Fill #0

## 2018-09-20 MED FILL — predniSONE 10 MG (21) TBPK: 10 | 6 days supply | Qty: 21 | Fill #0

## 2018-09-24 MED FILL — METOPROLOL SUCCINATE ER 50: 50 | 90 days supply | Qty: 90 | Fill #0

## 2018-09-25 MED FILL — GABAPENTIN 300 MG CAPSULE: 300 | 30 days supply | Qty: 30 | Fill #0

## 2018-10-08 MED FILL — traMADol HCL 50 MG TABS: 50 | 10 days supply | Qty: 10 | Fill #0

## 2018-10-25 MED FILL — PANTOPRAZOLE SOD DR 40 MG T: 40 | 30 days supply | Qty: 60 | Fill #1

## 2018-10-25 MED FILL — GABAPENTIN 300 MG CAPSULE: 300 | 30 days supply | Qty: 30 | Fill #1

## 2018-11-19 MED FILL — CYCLOBENZAPRINE HCL 10 MG T: 10 | 20 days supply | Qty: 60 | Fill #1

## 2018-11-19 MED FILL — traMADol HCL 50 MG TABS: 50 | 30 days supply | Qty: 30 | Fill #0

## 2018-11-26 MED FILL — GABAPENTIN 300 MG CAPSULE: 300 | 30 days supply | Qty: 30 | Fill #0

## 2018-12-07 MED FILL — METOPROLOL SUCCINATE ER 50: 50 | 90 days supply | Qty: 90 | Fill #1

## 2018-12-07 MED FILL — PANTOPRAZOLE SOD DR 40 MG T: 40 | 30 days supply | Qty: 60 | Fill #2

## 2018-12-13 MED FILL — PANTOPRAZOLE SOD DR 40 MG T: 40 | 30 days supply | Qty: 60 | Fill #3

## 2018-12-25 MED FILL — traMADol HCL 50 MG TABS: 50 | 30 days supply | Qty: 30 | Fill #0

## 2019-01-18 MED FILL — CYCLOBENZAPRINE HCL 10 MG T: 10 | 20 days supply | Qty: 60 | Fill #2

## 2019-02-22 MED FILL — METOPROLOL SUCCINATE ER 50: 50 | 90 days supply | Qty: 90 | Fill #2

## 2019-03-04 MED FILL — traMADol HCL 50 MG TABS: 50 | 30 days supply | Qty: 30 | Fill #1

## 2019-03-26 MED FILL — CYCLOBENZAPRINE HCL 10 MG T: 10 | 20 days supply | Qty: 60 | Fill #3

## 2019-03-26 MED FILL — GABAPENTIN 300 MG CAPSULE: 300 | 30 days supply | Qty: 30 | Fill #1

## 2019-04-01 MED FILL — PANTOPRAZOLE SOD DR 40 MG T: 40 | 30 days supply | Qty: 60 | Fill #4

## 2019-04-30 MED FILL — OXYCODONE-ACETAMINOPHEN 5-3: 5-325 | 5 days supply | Qty: 20 | Fill #0

## 2019-04-30 MED FILL — predniSONE 10 MG TABS: 10 | 12 days supply | Qty: 30 | Fill #0

## 2019-05-01 ENCOUNTER — Other Ambulatory Visit (HOSPITAL_COMMUNITY): Payer: Self-pay | Admitting: Family Medicine

## 2019-05-01 DIAGNOSIS — M5441 Lumbago with sciatica, right side: Secondary | ICD-10-CM

## 2019-05-08 ENCOUNTER — Encounter (HOSPITAL_COMMUNITY): Payer: Self-pay

## 2019-05-08 ENCOUNTER — Ambulatory Visit (HOSPITAL_COMMUNITY): Admission: RE | Admit: 2019-05-08 | Payer: No Typology Code available for payment source | Source: Ambulatory Visit

## 2019-05-17 MED FILL — PANTOPRAZOLE SOD DR 40 MG T: 40 | 90 days supply | Qty: 180 | Fill #0

## 2019-05-17 MED FILL — GABAPENTIN 300 MG CAPSULE: 300 | 30 days supply | Qty: 30 | Fill #0

## 2019-05-17 MED FILL — traMADol HCL 50 MG TABS: 50 | 45 days supply | Qty: 45 | Fill #0

## 2019-05-27 MED FILL — GABAPENTIN 300 MG CAPSULE: 300 | 30 days supply | Qty: 30 | Fill #0

## 2019-05-27 MED FILL — PANTOPRAZOLE SOD DR 40 MG T: 40 | 90 days supply | Qty: 180 | Fill #0

## 2019-05-28 ENCOUNTER — Ambulatory Visit (HOSPITAL_COMMUNITY)
Admission: RE | Admit: 2019-05-28 | Discharge: 2019-05-28 | Disposition: A | Discharge status: Home / Self Care | Payer: No Typology Code available for payment source | Source: Ambulatory Visit | Attending: Family Medicine | Admitting: Family Medicine

## 2019-05-28 ENCOUNTER — Other Ambulatory Visit: Payer: Self-pay

## 2019-05-28 DIAGNOSIS — M5441 Lumbago with sciatica, right side: Secondary | ICD-10-CM | POA: Diagnosis not present

## 2019-06-07 MED FILL — CYCLOBENZAPRINE HCL 10 MG T: 10 | 20 days supply | Qty: 60 | Fill #4

## 2019-06-28 ENCOUNTER — Other Ambulatory Visit: Payer: Self-pay

## 2019-06-28 DIAGNOSIS — Z20822 Contact with and (suspected) exposure to covid-19: Secondary | ICD-10-CM

## 2019-06-30 LAB — NOVEL CORONAVIRUS, NAA: SARS-CoV-2, NAA: NOT DETECTED

## 2019-07-15 MED FILL — HYDROCODON-APAP 5-325: 5-325 | 15 days supply | Qty: 15 | Fill #0

## 2019-07-15 MED FILL — TAMSULOSIN HCL 0.4 MG CAP: 0.4 | 30 days supply | Qty: 30 | Fill #0

## 2019-07-15 MED FILL — MELOXICAM 15 MG TABLET: 15 | 30 days supply | Qty: 30 | Fill #0

## 2019-07-17 MED FILL — traMADol HCL 50 MG TABS: 50 | 30 days supply | Qty: 60 | Fill #0

## 2019-08-07 MED FILL — traMADol HCL 50 MG TABS: 50 | 30 days supply | Qty: 60 | Fill #0

## 2019-08-12 MED FILL — CYCLOBENZAPRINE HCL 10 MG T: 10 | 20 days supply | Qty: 60 | Fill #0

## 2019-08-13 MED FILL — TAMSULOSIN HCL 0.4 MG CAP: 0.4 | 30 days supply | Qty: 30 | Fill #1

## 2019-08-13 MED FILL — MELOXICAM 15 MG TABLET: 15 | 30 days supply | Qty: 30 | Fill #1

## 2019-08-19 MED FILL — CLOBETASOL PROPIONATE 0.05: 0.05 | 20 days supply | Qty: 60 | Fill #0

## 2019-08-20 ENCOUNTER — Other Ambulatory Visit: Payer: Self-pay

## 2019-08-20 ENCOUNTER — Encounter: Payer: Self-pay | Admitting: Physical Therapy

## 2019-08-20 ENCOUNTER — Ambulatory Visit
Payer: No Typology Code available for payment source | Attending: Physical Medicine and Rehabilitation | Admitting: Physical Therapy

## 2019-08-20 DIAGNOSIS — M6281 Muscle weakness (generalized): Secondary | ICD-10-CM | POA: Insufficient documentation

## 2019-08-20 DIAGNOSIS — M542 Cervicalgia: Secondary | ICD-10-CM | POA: Diagnosis present

## 2019-08-21 ENCOUNTER — Encounter: Payer: Self-pay | Admitting: Physical Therapy

## 2019-08-21 MED FILL — HYDROCODON-APAP 5-325: 5-325 | 15 days supply | Qty: 15 | Fill #0
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
## 2019-08-29 ENCOUNTER — Ambulatory Visit: Payer: No Typology Code available for payment source | Admitting: Physical Therapy

## 2019-08-29 ENCOUNTER — Encounter: Payer: Self-pay | Admitting: Physical Therapy

## 2019-08-29 ENCOUNTER — Other Ambulatory Visit: Payer: Self-pay

## 2019-08-29 DIAGNOSIS — M542 Cervicalgia: Secondary | ICD-10-CM

## 2019-08-29 DIAGNOSIS — M6281 Muscle weakness (generalized): Secondary | ICD-10-CM

## 2019-08-29 NOTE — Therapy (Signed)
[place], [name], [address] Phone: [phone]   Fax:  [phone]  Physical Therapy Treatment  Patient Details  Name: Roy Watson MRN: [id number] Date of Birth: Jul 28, 1961 Referring Provider (PT): Zettie Pho, MD   Encounter Date: 08/29/2019  PT End of Session - 08/29/19 1541    Visit Number  2    Number of Visits  8    Date for PT Re-Evaluation  10/15/19    Authorization Type  [facility] FOCUS    PT Start Time  [id number]    PT Stop Time  1625    PT Time Calculation (min)  43 min    Activity Tolerance  Patient tolerated treatment well       Past Medical History:  Diagnosis Date  . Hypertension     Past Surgical History:  Procedure Laterality Date  . APPENDECTOMY    . BACK SURGERY    . KNEE ARTHROPLASTY Left     There were no vitals filed for this visit.      [facility] PT Assessment - 08/29/19 0001      Assessment   Medical Diagnosis  Chronic neck pain                   [facility] Adult PT Treatment/Exercise - 08/29/19 0001      Self-Care   Self-Care  Other Self-Care Comments    Other Self-Care Comments   how to perform MTPR tecniques and tools that can assist with self treatment and where to find the tools.       Neck Exercises: Machines for Strengthening   UBE (Upper Arm Bike)  L4 x 4 min    changing direction at      Neck Exercises: Seated   Other Seated Exercise  scapular retraction with ER 2 x 10 with red theraband      Neck Exercises: Supine   Cervical Isometrics  Extension;5 secs;5 reps    Neck Retraction  5 secs;5 reps   chin tuck head lift   Other Supine Exercise  lower trap strengthening 2 x 10 with red theraband and shoulder flexion      Manual Therapy   Manual Therapy  Joint mobilization;Other (comment)    Manual therapy comments  MTPR along the R upper trap, levator scapulae, and sub-occipitals    Joint Mobilization  R first rib mobs grade III,     Other  Manual Therapy  R upper trap inhibition      Neck Exercises: Stretches   Upper Trapezius Stretch  2 reps;30 seconds    Levator Stretch  --             PT Education - 08/29/19 1629    Education Details  reviewed HEP and how to perforom manual trigger point release    Person(s) Educated  Patient    Methods  Explanation;Verbal cues;Handout    Comprehension  Verbalized understanding;Verbal cues required       PT Short Term Goals - 08/21/19 0929      PT SHORT TERM GOAL #1   Title  Patient will be I with HEP in order to maintain progress in PT.    Time  4    Period  Weeks    Status  New    Target Date  09/17/19      PT SHORT TERM GOAL #2   Title  Patient will report decreased pain level of 1-2/10 following a day at work  to indicate improve work tolerance.    Time  4    Period  Weeks    Status  New    Target Date  09/17/19      PT SHORT TERM GOAL #3   Title  Patient will exhibit right cervical rotation > or = 5 deg improvement to equal opposite side and ease driving.    Time  4    Period  Weeks    Status  New    Target Date  09/17/19        PT Long Term Goals - 08/21/19 0932      PT LONG TERM GOAL #1   Title  Patient will exhibit improved endurance of deep neck flexor to 20 sec and periscapular strength to > or = 4+/5 in order to improve posture and reduce stress on neck.    Time  8    Period  Weeks    Status  New    Target Date  10/15/19      PT LONG TERM GOAL #2   Title  Patient will report < or = 1-2/10 pain level and minimal neck fatigue following a day at work to improve work tolerance and reduce pain.    Time  8    Period  Weeks    Status  New    Target Date  10/15/19      PT LONG TERM GOAL #3   Title  Patient will report no increased pain with all cervical motion to improve work and driving ability.    Time  8    Period  Weeks    Status  New    Target Date  10/15/19      PT LONG TERM GOAL #4   Title  Patient will report improved functional  ability with < or = 40% limitation on FOTO.    Time  8    Period  Weeks    Status  New    Target Date  10/15/19            Plan - 08/29/19 1623    Clinical Impression Statement  pt reports continued pain in the neck today rated at a 5/10. worked on STW focusing on cervical parapsinals, sub-occipitals and R upper trap. Trialed inhibition taping on the R upper trap to relieve tension. continued shoulder strengthening to promote stability, and cerivcal DNF activation which he fatigues quickly.    PT Treatment/Interventions  ADLs/Self Care Home Management;Cryotherapy;Electrical Stimulation;Moist Heat;Traction;Therapeutic activities;Therapeutic exercise;Neuromuscular re-education;Patient/family education;Dry needling;Passive range of motion;Manual techniques;Taping;Spinal Manipulations;Joint Manipulations    PT Next Visit Plan  Assess HEP, progress deep neck flexor and periscapular/cuff strengthening, cervical mobility and upper trap stretching, manual and modalities PRN did he get a theracane.    PT Home Exercise Plan  Seated chin tuck, seated upper trap and levator stretch, row with red, double ER with yellow, supine horiz abd with yellow       Patient will benefit from skilled therapeutic intervention in order to improve the following deficits and impairments:  Decreased range of motion, Decreased endurance, Decreased activity tolerance, Pain, Impaired flexibility, Decreased strength, Postural dysfunction  Visit Diagnosis: Cervicalgia  Muscle weakness (generalized)     Problem List Patient Active Problem List   Diagnosis Date Noted  . Postconcussion syndrome 09/15/2016  . Posttraumatic headache 09/15/2016  . Hyperreflexia 09/15/2016  . Benign paroxysmal positional vertigo 09/15/2016  . Chronic low back pain 09/15/2016  . Insomnia 09/15/2016  . Neck pain 07/01/2014  .  Chronic back pain 04/18/2014   Starr Lake PT, DPT, LAT, ATC  08/29/19  4:31 PM      [facility] [address] [place], [name], [address] Phone: [phone]   Fax:  [phone]  Name: Roy Watson MRN: [id number] Date of Birth: 1961/06/23

## 2019-09-10 ENCOUNTER — Other Ambulatory Visit: Payer: Self-pay

## 2019-09-10 ENCOUNTER — Encounter: Payer: Self-pay | Admitting: Physical Therapy

## 2019-09-10 ENCOUNTER — Ambulatory Visit: Payer: No Typology Code available for payment source | Admitting: Physical Therapy

## 2019-09-10 DIAGNOSIS — M542 Cervicalgia: Secondary | ICD-10-CM | POA: Diagnosis not present

## 2019-09-10 DIAGNOSIS — M6281 Muscle weakness (generalized): Secondary | ICD-10-CM

## 2019-09-11 NOTE — Therapy (Signed)
[place], [name], [address] Phone: [phone]   Fax:  [phone]  Physical Therapy Treatment  Patient Details  Name: Roy Watson MRN: [id number] Date of Birth: [birth year] Referring Provider (PT): Zettie Pho, MD   Encounter Date: 09/10/2019  PT End of Session - 09/10/19 1624    Visit Number  3    Number of Visits  8    Date for PT Re-Evaluation  10/15/19    Authorization Type  [facility]US    PT Start Time  [id number]    PT Stop Time  1659    PT Time Calculation (min)  44 min    Activity Tolerance  Patient tolerated treatment well    Behavior During Therapy  [facility] for tasks assessed/performed       Past Medical History:  Diagnosis Date  . Hypertension     Past Surgical History:  Procedure Laterality Date  . APPENDECTOMY    . BACK SURGERY    . KNEE ARTHROPLASTY Left     There were no vitals filed for this visit.  Subjective Assessment - 09/10/19 1620    Subjective  Patient reports he is a little sore today. He had to work under a sink all day and he didn't sleep very well yesterday. Discomfort today is mainly located mid-lower cervical and greater on right side. He continues to have numbness at night when he lies in a still position.    Currently in Pain?  Yes    Pain Score  6     Pain Location  Neck    Pain Orientation  Right;Left;Mid;Lower    Pain Descriptors / Indicators  Aching;Sharp    Pain Type  Chronic pain    Pain Radiating Towards  right top of shoulder    Pain Onset  More than a month ago    Pain Frequency  Intermittent         [facility] PT Assessment - 09/11/19 0001      AROM   Cervical - Right Side Bend  38    Cervical - Left Side Bend  40    Cervical - Right Rotation  48    Cervical - Left Rotation  50                   [facility] Adult PT Treatment/Exercise - 09/11/19 0001      Exercises   Exercises  Neck      Neck Exercises: Machines for Strengthening    UBE (Upper Arm Bike)  L4 x4 min (fwd/bwd)      Neck Exercises: Seated   Other Seated Exercise  Trialed cervical SNAG for rotation with towel - patient did not report any improvement in rotational discomfort    Other Seated Exercise  Scapular retraction with ER with red theraband 2x10      Neck Exercises: Supine   Neck Retraction  10 reps;5 secs   2 sets   Neck Retraction Limitations  cued for technique and chin tuck      Manual Therapy   Manual Therapy  Joint mobilization;Myofascial release    Manual therapy comments  MTPR along the R upper trap, levator scapulae, and sub-occipitals    Joint Mobilization  Lower cervical and upper-mid thoracic CPA grade III-IV    Myofascial Release  MTPR along the R upper trap, levator scapulae, and sub-occipitals      Neck Exercises: Stretches   Upper Trapezius Stretch  2 reps;30 seconds  Levator Stretch  2 reps;30 seconds             PT Education - 09/10/19 1623    Education Details  HEP    Person(s) Educated  Patient    Methods  Explanation;Demonstration;Verbal cues;Handout    Comprehension  Verbalized understanding;Returned demonstration;Verbal cues required;Need further instruction       PT Short Term Goals - 08/21/19 0929      PT SHORT TERM GOAL #1   Title  Patient will be I with HEP in order to maintain progress in PT.    Time  4    Period  Weeks    Status  New    Target Date  09/17/19      PT SHORT TERM GOAL #2   Title  Patient will report decreased pain level of 1-2/10 following a day at work to indicate improve work tolerance.    Time  4    Period  Weeks    Status  New    Target Date  09/17/19      PT SHORT TERM GOAL #3   Title  Patient will exhibit right cervical rotation > or = 5 deg improvement to equal opposite side and ease driving.    Time  4    Period  Weeks    Status  New    Target Date  09/17/19        PT Long Term Goals - 08/21/19 0932      PT LONG TERM GOAL #1   Title  Patient will exhibit  improved endurance of deep neck flexor to 20 sec and periscapular strength to > or = 4+/5 in order to improve posture and reduce stress on neck.    Time  8    Period  Weeks    Status  New    Target Date  10/15/19      PT LONG TERM GOAL #2   Title  Patient will report < or = 1-2/10 pain level and minimal neck fatigue following a day at work to improve work tolerance and reduce pain.    Time  8    Period  Weeks    Status  New    Target Date  10/15/19      PT LONG TERM GOAL #3   Title  Patient will report no increased pain with all cervical motion to improve work and driving ability.    Time  8    Period  Weeks    Status  New    Target Date  10/15/19      PT LONG TERM GOAL #4   Title  Patient will report improved functional ability with < or = 40% limitation on FOTO.    Time  8    Period  Weeks    Status  New    Target Date  10/15/19            Plan - 09/11/19 1504    Clinical Impression Statement  Patient continues to report neck pain and weakness/fatigue of her neck. This visit focused on continuation of STW and thoracic mobility, which patient reports helps for a short time. He continues to exhibit weakness and fatigue of DNFs and was encouraged to work on these supine with longer duration holds at home to [employer] endurance. He would benefit from continued skilled PT to improve his pain and feeling of cervical stability.    PT Treatment/Interventions  ADLs/Self Care Home Management;Cryotherapy;Electrical Stimulation;Moist Heat;Traction;Therapeutic activities;Therapeutic exercise;Neuromuscular re-education;Patient/family  education;Dry needling;Passive range of motion;Manual techniques;Taping;Spinal Manipulations;Joint Manipulations    PT Next Visit Plan  Assess HEP, progress deep neck flexor and periscapular/cuff strengthening, cervical mobility and upper trap stretching, manual and modalities PRN    PT Home Exercise Plan  Supine chin tuck hold, seated upper trap and levator  stretch, row with red, double ER with yellow, supine horiz abd with yellow    Consulted and Agree with Plan of Care  Patient       Patient will benefit from skilled therapeutic intervention in order to improve the following deficits and impairments:  Decreased range of motion, Decreased endurance, Decreased activity tolerance, Pain, Impaired flexibility, Decreased strength, Postural dysfunction  Visit Diagnosis: Cervicalgia  Muscle weakness (generalized)     Problem List Patient Active Problem List   Diagnosis Date Noted  . Postconcussion syndrome 09/15/2016  . Posttraumatic headache 09/15/2016  . Hyperreflexia 09/15/2016  . Benign paroxysmal positional vertigo 09/15/2016  . Chronic low back pain 09/15/2016  . Insomnia 09/15/2016  . Neck pain 07/01/2014  . Chronic back pain 04/18/2014    Hilda Blades, PT, DPT, LAT, ATC 09/11/19  3:51 PM Phone: [phone] Fax: [facility] [address] [place], [name], [address] Phone: [phone]   Fax:  [phone]  Name: Antuan Schwed MRN: [id number] Date of Birth: 14-Feb-1961

## 2019-09-12 MED FILL — GABAPENTIN 300 MG CAPSULE: 300 | 30 days supply | Qty: 30 | Fill #1

## 2019-09-12 MED FILL — SILDENAFIL CITRATE 100 MG T: 100 | 30 days supply | Qty: 6 | Fill #0

## 2019-09-16 ENCOUNTER — Ambulatory Visit
Payer: No Typology Code available for payment source | Attending: Physical Medicine and Rehabilitation | Admitting: Physical Therapy

## 2019-09-16 ENCOUNTER — Other Ambulatory Visit: Payer: Self-pay

## 2019-09-16 ENCOUNTER — Encounter: Payer: Self-pay | Admitting: Physical Therapy

## 2019-09-16 DIAGNOSIS — M6281 Muscle weakness (generalized): Secondary | ICD-10-CM | POA: Diagnosis present

## 2019-09-16 DIAGNOSIS — M542 Cervicalgia: Secondary | ICD-10-CM | POA: Diagnosis present

## 2019-09-16 NOTE — Therapy (Signed)
[place], [name], [address] Phone: [phone]   Fax:  [phone]  Physical Therapy Treatment  Patient Details  Name: Roy Watson MRN: [id number] Date of Birth: [birth year] Referring Provider (PT): Zettie Pho, MD   Encounter Date: 09/16/2019  PT End of Session - 09/16/19 1649    Visit Number  4    Number of Visits  8    Date for PT Re-Evaluation  10/15/19    Authorization Type  [facility] FOCUS    PT Start Time  5916    PT Stop Time  3846    PT Time Calculation (min)  31 min    Activity Tolerance  Patient tolerated treatment well    Behavior During Therapy  [facility] for tasks assessed/performed       Past Medical History:  Diagnosis Date  . Hypertension     Past Surgical History:  Procedure Laterality Date  . APPENDECTOMY    . BACK SURGERY    . KNEE ARTHROPLASTY Left     There were no vitals filed for this visit.  Subjective Assessment - 09/16/19 1646    Subjective  Patient reports he is doing well, he notes that he is feeling pretty good today but overally is about the same.    Currently in Pain?  Yes    Pain Score  5     Pain Location  Neck    Pain Orientation  Right;Left;Mid;Lower    Pain Descriptors / Indicators  Aching;Sharp    Pain Type  Chronic pain    Pain Onset  More than a month ago    Pain Frequency  Intermittent         [facility] PT Assessment - 09/16/19 0001      AROM   Cervical - Right Rotation  50    Cervical - Left Rotation  50                   [facility] Adult PT Treatment/Exercise - 09/16/19 0001      Exercises   Exercises  Neck      Neck Exercises: Machines for Strengthening   UBE (Upper Arm Bike)  L4 x3 min (fwd/bwd)      Neck Exercises: Standing   Other Standing Exercises  Cervical side bending isometrics with ball 2x10 3 sec hold      Neck Exercises: Seated   Cervical Isometrics  Extension;10 reps;3 secs   2 sets   Cervical Isometrics  Limitations  red band       Neck Exercises: Supine   Neck Retraction  5 reps;5 secs   2 sets   Neck Retraction Limitations  cued for technique and chin tuck    Other Supine Exercise  Chin tuck with head lift 2x5 - cued for technique      Manual Therapy   Manual Therapy  Myofascial release;Manual Traction    Manual therapy comments  --    Myofascial Release  MTPR along the R upper trap, levator scapulae, and sub-occipitals    Manual Traction  With suboccipital release      Neck Exercises: Stretches   Upper Trapezius Stretch  2 reps;30 seconds    Levator Stretch  2 reps;30 seconds             PT Education - 09/16/19 1649    Education Details  HEP    Person(s) Educated  Patient    Methods  Explanation;Demonstration;Verbal cues  Comprehension  Verbalized understanding;Returned demonstration;Verbal cues required       PT Short Term Goals - 09/16/19 1720      PT SHORT TERM GOAL #1   Title  Patient will be I with HEP in order to maintain progress in PT.    Time  4    Period  Weeks    Status  On-going    Target Date  09/17/19      PT SHORT TERM GOAL #2   Title  Patient will report decreased pain level of 1-2/10 following a day at work to indicate improve work tolerance.    Baseline  Continues to be ~5/10    Time  4    Period  Weeks    Status  Not Met    Target Date  09/17/19      PT SHORT TERM GOAL #3   Title  Patient will exhibit right cervical rotation > or = 5 deg improvement to equal opposite side and ease driving.    Time  4    Period  Weeks    Status  Achieved    Target Date  09/17/19        PT Long Term Goals - 08/21/19 0932      PT LONG TERM GOAL #1   Title  Patient will exhibit improved endurance of deep neck flexor to 20 sec and periscapular strength to > or = 4+/5 in order to improve posture and reduce stress on neck.    Time  8    Period  Weeks    Status  New    Target Date  10/15/19      PT LONG TERM GOAL #2   Title  Patient will report <  or = 1-2/10 pain level and minimal neck fatigue following a day at work to improve work tolerance and reduce pain.    Time  8    Period  Weeks    Status  New    Target Date  10/15/19      PT LONG TERM GOAL #3   Title  Patient will report no increased pain with all cervical motion to improve work and driving ability.    Time  8    Period  Weeks    Status  New    Target Date  10/15/19      PT LONG TERM GOAL #4   Title  Patient will report improved functional ability with < or = 40% limitation on FOTO.    Time  8    Period  Weeks    Status  New    Target Date  10/15/19            Plan - 09/16/19 1717    Clinical Impression Statement  Patient continues to report neck pain with weakness and fatigue. He exhibited difficulty lefting his head off the pillow while supine. He tolerated the addition of cervical isometrcis well, but did report challenge and fatigue with the exercises. He would benefit from continued skilled PT to improve his pain and feeling of cervical stability.    PT Treatment/Interventions  ADLs/Self Care Home Management;Cryotherapy;Electrical Stimulation;Moist Heat;Traction;Therapeutic activities;Therapeutic exercise;Neuromuscular re-education;Patient/family education;Dry needling;Passive range of motion;Manual techniques;Taping;Spinal Manipulations;Joint Manipulations    PT Next Visit Plan  Assess HEP, progress deep neck flexor and periscapular/cuff strengthening, cervical isometrics, cervical mobility and upper trap stretching, manual and modalities PRN    PT Home Exercise Plan  Supine chin tuck hold, seated upper trap and levator stretch, row  with red, double ER with yellow, supine horiz abd with yellow, cervical isometric lat bending with pillow and extension with band    Consulted and Agree with Plan of Care  Patient       Patient will benefit from skilled therapeutic intervention in order to improve the following deficits and impairments:  Decreased range of  motion, Decreased endurance, Decreased activity tolerance, Pain, Impaired flexibility, Decreased strength, Postural dysfunction  Visit Diagnosis: Cervicalgia  Muscle weakness (generalized)     Problem List Patient Active Problem List   Diagnosis Date Noted  . Postconcussion syndrome 09/15/2016  . Posttraumatic headache 09/15/2016  . Hyperreflexia 09/15/2016  . Benign paroxysmal positional vertigo 09/15/2016  . Chronic low back pain 09/15/2016  . Insomnia 09/15/2016  . Neck pain 07/01/2014  . Chronic back pain 04/18/2014    Hilda Blades, PT, DPT, LAT, ATC 09/16/19  5:28 PM Phone: [phone] Fax: [facility] [address] [place], [name], [address] Phone: [phone]   Fax:  [phone]  Name: Roy Watson MRN: [id number] Date of Birth: [birth year]

## 2019-09-23 MED FILL — traMADol HCL 50 MG TABS: 50 | 30 days supply | Qty: 60 | Fill #1

## 2019-09-23 MED FILL — HYDROCODON-APAP 5-325: 5-325 | 15 days supply | Qty: 15 | Fill #0

## 2019-09-26 ENCOUNTER — Other Ambulatory Visit: Payer: Self-pay

## 2019-09-26 ENCOUNTER — Ambulatory Visit: Payer: No Typology Code available for payment source | Admitting: Physical Therapy

## 2019-09-26 ENCOUNTER — Encounter: Payer: Self-pay | Admitting: Physical Therapy

## 2019-09-26 DIAGNOSIS — M542 Cervicalgia: Secondary | ICD-10-CM | POA: Diagnosis not present

## 2019-09-26 DIAGNOSIS — M6281 Muscle weakness (generalized): Secondary | ICD-10-CM

## 2019-09-27 NOTE — Therapy (Addendum)
Roy [place], [name], [address] Phone: [phone]   Fax:  [phone]  Physical Therapy Treatment / Discharge  Patient Details  Name: Roy Watson MRN: [id number] Date of Birth: Dec 29, 1960 Referring Provider (PT): Zettie Pho, MD   Encounter Date: 09/26/2019  PT End of Session - 09/26/19 1621    Visit Number  5    Number of Visits  8    Date for PT Re-Evaluation  10/15/19    Authorization Type  [facility] [employer]US    PT Start Time  5093    PT Stop Time  1655    PT Time Calculation (min)  40 min    Activity Tolerance  Patient tolerated treatment well    Behavior During Therapy  [facility] for tasks assessed/performed       Past Medical History:  Diagnosis Date  . Hypertension     Past Surgical History:  Procedure Laterality Date  . APPENDECTOMY    . BACK SURGERY    . KNEE ARTHROPLASTY Left     There were no vitals filed for this visit.  Subjective Assessment - 09/26/19 1618    Subjective  Patient reports he has been busy this week and has had to back off on his exercises because he was having more pain.    Currently in Pain?  Yes    Pain Score  6     Pain Location  Neck    Pain Orientation  Right;Left;Mid    Pain Descriptors / Indicators  Aching;Sharp    Pain Type  Chronic pain    Pain Onset  More than a month ago    Pain Frequency  Intermittent                       [facility] Adult PT Treatment/Exercise - 09/27/19 0001      Exercises   Exercises  Neck      Neck Exercises: Machines for Strengthening   UBE (Upper Arm Bike)  L4 x4 min (fwd/bwd)      Neck Exercises: Standing   Other Standing Exercises  Use of peanut ball for SMFR along thoracic and cervical region - performed in supine, seated, and standing against wall      Neck Exercises: Seated   Cervical Isometrics  Extension;10 reps;3 secs    Cervical Isometrics Limitations  red band       Neck Exercises: Supine   Neck  Retraction  5 secs;5 reps   2 sets   Neck Retraction Limitations  cued for technique and chin tuck      Neck Exercises: Sidelying   Other Sidelying Exercise  Trialed side lying thoracic rotation stretch but patient report LBP so d/c'd      Manual Therapy   Manual Therapy  Myofascial release;Manual Traction    Myofascial Release  MTPR along the R upper trap, levator scapulae, rhomboids, and sub-occipitals - supine and prone    Manual Traction  With suboccipital release      Neck Exercises: Stretches   Upper Trapezius Stretch  2 reps;30 seconds    Levator Stretch  2 reps;30 seconds             PT Education - 09/26/19 1621    Education Details  HEP, SMFR    Person(s) Educated  Patient    Methods  Explanation;Demonstration;Verbal cues    Comprehension  Verbalized understanding;Returned demonstration;Tactile cues required;Need further instruction  PT Short Term Goals - 09/16/19 1720      PT SHORT TERM GOAL #1   Title  Patient will be I with HEP in order to maintain progress in PT.    Time  4    Period  Weeks    Status  On-going    Target Date  09/17/19      PT SHORT TERM GOAL #2   Title  Patient will report decreased pain level of 1-2/10 following a day at work to indicate improve work tolerance.    Baseline  Continues to be ~5/10    Time  4    Period  Weeks    Status  Not Met    Target Date  09/17/19      PT SHORT TERM GOAL #3   Title  Patient will exhibit right cervical rotation > or = 5 deg improvement to equal opposite side and ease driving.    Time  4    Period  Weeks    Status  Achieved    Target Date  09/17/19        PT Long Term Goals - 08/21/19 0932      PT LONG TERM GOAL #1   Title  Patient will exhibit improved endurance of deep neck flexor to 20 sec and periscapular strength to > or = 4+/5 in order to improve posture and reduce stress on neck.    Time  8    Period  Weeks    Status  New    Target Date  10/15/19      PT LONG TERM GOAL #2    Title  Patient will report < or = 1-2/10 pain level and minimal neck fatigue following a day at work to improve work tolerance and reduce pain.    Time  8    Period  Weeks    Status  New    Target Date  10/15/19      PT LONG TERM GOAL #3   Title  Patient will report no increased pain with all cervical motion to improve work and driving ability.    Time  8    Period  Weeks    Status  New    Target Date  10/15/19      PT LONG TERM GOAL #4   Title  Patient will report improved functional ability with < or = 40% limitation on FOTO.    Time  8    Period  Weeks    Status  New    Target Date  10/15/19            Plan - 09/26/19 1623    Clinical Impression Statement  Patient continues to report midline neck tenderness and muscular pain that seems to get worse with activity. He does report improvement with soft tissue work and has responded well to self techniques at home using theracane and tennis ball. His HEP was adjusted to take out lateral isometrics as the patient reported this aggravated his symptoms.    PT Treatment/Interventions  ADLs/Self Care Home Management;Cryotherapy;Electrical Stimulation;Moist Heat;Traction;Therapeutic activities;Therapeutic exercise;Neuromuscular re-education;Patient/family education;Dry needling;Passive range of motion;Manual techniques;Taping;Spinal Manipulations;Joint Manipulations    PT Next Visit Plan  Assess HEP, progress deep neck flexor and periscapular/cuff strengthening, cervical isometrics, cervical mobility and upper trap stretching, manual and modalities PRN    PT Home Exercise Plan  Supine chin tuck hold, seated upper trap and levator stretch, row with red, double ER with yellow, supine horiz abd with yellow,  cervical isometric lat bending with pillow and extension with band    Consulted and Agree with Plan of Care  Patient       Patient will benefit from skilled therapeutic intervention in order to improve the following deficits and  impairments:  Decreased range of motion, Decreased endurance, Decreased activity tolerance, Pain, Impaired flexibility, Decreased strength, Postural dysfunction  Visit Diagnosis: Cervicalgia  Muscle weakness (generalized)     Problem List Patient Active Problem List   Diagnosis Date Noted  . Postconcussion syndrome 09/15/2016  . Posttraumatic headache 09/15/2016  . Hyperreflexia 09/15/2016  . Benign paroxysmal positional vertigo 09/15/2016  . Chronic low back pain 09/15/2016  . Insomnia 09/15/2016  . Neck pain 07/01/2014  . Chronic back pain 04/18/2014    Hilda Blades, PT, DPT, LAT, ATC 09/27/19  8:14 AM Phone: [phone] Fax: [facility] [address] [place], [name], [address] Phone: [phone]   Fax:  [phone]  Name: Lean Fayson MRN: [id number] Date of Birth: 1960/10/28  PHYSICAL THERAPY DISCHARGE SUMMARY  Visits from Start of Care: 5  Current functional level related to goals / functional outcomes: See above   Remaining deficits: See above   Education / Equipment: HEP  Plan: Patient agrees to discharge.  Patient goals were not met. Patient is being discharged due to not returning since the last visit.  ?????    Hilda Blades, PT, DPT, LAT, ATC 11/26/19  3:00 PM Phone: [phone] Fax: [phone]

## 2019-10-03 MED FILL — METOPROLOL SUCCINATE ER 50: 50 | 90 days supply | Qty: 90 | Fill #0

## 2019-10-08 ENCOUNTER — Ambulatory Visit: Payer: No Typology Code available for payment source | Admitting: Physical Therapy

## 2019-10-11 MED FILL — CYCLOBENZAPRINE HCL 10 MG T: 10 | 20 days supply | Qty: 60 | Fill #0

## 2019-10-11 MED FILL — GABAPENTIN 300 MG CAPSULE: 300 | 30 days supply | Qty: 30 | Fill #0

## 2019-10-15 MED FILL — MELOXICAM 15 MG TABLET: 15 | 30 days supply | Qty: 30 | Fill #0

## 2019-10-30 MED FILL — PREGABALIN 50 MG CAPS: 50 | 60 days supply | Qty: 60 | Fill #0

## 2019-11-04 MED FILL — PANTOPRAZOLE SOD DR 40 MG T: 40 | 60 days supply | Qty: 120 | Fill #1

## 2019-11-04 MED FILL — HYDROCODON-APAP 5-325: 5-325 | 15 days supply | Qty: 15 | Fill #0

## 2019-11-14 MED FILL — traMADol HCL 50 MG TABS: 50 | 30 days supply | Qty: 60 | Fill #2

## 2019-12-05 MED FILL — HYDROCODON-APAP 5-325: 5-325 | 15 days supply | Qty: 15 | Fill #0

## 2019-12-05 MED FILL — SILDENAFIL CITRATE 100 MG T: 100 | 30 days supply | Qty: 6 | Fill #1

## 2020-01-01 MED FILL — METOPROLOL SUCCINATE ER 50: 50 | 90 days supply | Qty: 90 | Fill #0

## 2020-01-02 MED FILL — HYDROCODON-APAP 5-325: 5-325 | 15 days supply | Qty: 15 | Fill #0

## 2020-01-16 MED FILL — traMADol HCL 50 MG TABS: 50 | 30 days supply | Qty: 60 | Fill #0

## 2020-01-16 MED FILL — SILDENAFIL CITRATE 100 MG T: 100 | 30 days supply | Qty: 6 | Fill #0

## 2020-01-17 ENCOUNTER — Other Ambulatory Visit (HOSPITAL_COMMUNITY): Payer: Self-pay | Admitting: Neurosurgery

## 2020-01-17 ENCOUNTER — Other Ambulatory Visit: Payer: Self-pay | Admitting: Neurosurgery

## 2020-01-17 DIAGNOSIS — G959 Disease of spinal cord, unspecified: Secondary | ICD-10-CM

## 2020-01-17 MED FILL — GABAPENTIN 300 MG CAPSULE: 300 | 30 days supply | Qty: 30 | Fill #0

## 2020-01-29 ENCOUNTER — Other Ambulatory Visit: Payer: Self-pay

## 2020-01-29 ENCOUNTER — Ambulatory Visit (HOSPITAL_COMMUNITY)
Admission: RE | Admit: 2020-01-29 | Discharge: 2020-01-29 | Disposition: A | Discharge status: Home / Self Care | Payer: No Typology Code available for payment source | Source: Ambulatory Visit | Attending: Neurosurgery | Admitting: Neurosurgery

## 2020-01-29 DIAGNOSIS — G959 Disease of spinal cord, unspecified: Secondary | ICD-10-CM

## 2020-02-04 MED FILL — HYDROCODON-APAP 5-325: 5-325 | 15 days supply | Qty: 15 | Fill #0

## 2020-02-13 MED FILL — traMADol HCL 50 MG TABS: 50 | 30 days supply | Qty: 60 | Fill #0

## 2020-02-13 MED FILL — PREGABALIN 50 MG CAPS: 50 | 60 days supply | Qty: 60 | Fill #1

## 2020-02-20 ENCOUNTER — Other Ambulatory Visit: Payer: Self-pay | Admitting: Nurse Practitioner

## 2020-02-21 ENCOUNTER — Other Ambulatory Visit: Payer: Self-pay | Admitting: Nurse Practitioner

## 2020-03-05 MED FILL — TAMSULOSIN HCL 0.4 MG CAP: 0.4 | 30 days supply | Qty: 30 | Fill #2

## 2020-03-09 MED FILL — CYCLOBENZAPRINE HCL 10 MG T: 10 | 30 days supply | Qty: 90 | Fill #0

## 2020-03-09 MED FILL — HYDROCODON-APAP 5-325: 5-325 | 15 days supply | Qty: 15 | Fill #0

## 2020-03-27 MED FILL — SILDENAFIL CITRATE 100 MG T: 100 | 30 days supply | Qty: 6 | Fill #1

## 2020-03-27 MED FILL — traMADol HCL 50 MG TABS: 50 | 30 days supply | Qty: 60 | Fill #1

## 2020-04-07 MED FILL — METOPROLOL SUCCINATE ER 50: 50 | 90 days supply | Qty: 90 | Fill #1

## 2020-04-08 MED FILL — HYDROCODON-APAP 5-325: 5-325 | 15 days supply | Qty: 15 | Fill #0

## 2020-04-22 MED FILL — predniSONE 10 MG TABS: 10 | 6 days supply | Qty: 21 | Fill #0

## 2020-04-28 MED FILL — traMADol HCL 50 MG TABS: 50 | 30 days supply | Qty: 60 | Fill #2

## 2020-05-07 MED FILL — HYDROCODON-APAP 5-325: 5-325 | 15 days supply | Qty: 15 | Fill #0

## 2020-05-25 ENCOUNTER — Other Ambulatory Visit (HOSPITAL_COMMUNITY): Payer: Self-pay | Admitting: Family Medicine

## 2020-05-25 MED FILL — HYDROCODON-APAP 5-325: 5-325 | 20 days supply | Qty: 20 | Fill #0

## 2020-05-25 MED FILL — traMADol HCL 50 MG TABS: 50 | 30 days supply | Qty: 60 | Fill #0

## 2020-06-03 MED FILL — CYCLOBENZAPRINE HCL 10 MG T: 10 | 30 days supply | Qty: 90 | Fill #0

## 2020-06-03 MED FILL — HYDROCODON-APAP 5-325: 5-325 | 20 days supply | Qty: 20 | Fill #0

## 2020-06-03 MED FILL — traMADol HCL 50 MG TABS: 50 | 30 days supply | Qty: 60 | Fill #0

## 2020-06-05 ENCOUNTER — Other Ambulatory Visit (HOSPITAL_COMMUNITY): Payer: Self-pay | Admitting: Internal Medicine

## 2020-06-05 MED FILL — SILDENAFIL CITRATE 100 MG T: 100 | 30 days supply | Qty: 6 | Fill #0

## 2020-06-26 MED FILL — PANTOPRAZOLE SOD DR 40 MG T: 40 | 90 days supply | Qty: 180 | Fill #1

## 2020-07-02 ENCOUNTER — Other Ambulatory Visit (HOSPITAL_COMMUNITY): Payer: Self-pay | Admitting: Family Medicine

## 2020-07-02 MED FILL — GABAPENTIN 300 MG CAPSULE: 300 | 30 days supply | Qty: 30 | Fill #0

## 2020-07-08 ENCOUNTER — Other Ambulatory Visit (HOSPITAL_COMMUNITY): Payer: Self-pay | Admitting: Physical Medicine and Rehabilitation

## 2020-07-08 MED FILL — HYDROCODON-APAP 5-325: 5-325 | 20 days supply | Qty: 20 | Fill #0

## 2020-07-09 MED FILL — traMADol HCL 50 MG TABS: 50 | 30 days supply | Qty: 60 | Fill #1

## 2020-07-10 ENCOUNTER — Other Ambulatory Visit (HOSPITAL_COMMUNITY): Payer: Self-pay | Admitting: Internal Medicine

## 2020-07-10 MED FILL — METOPROLOL SUCCINATE ER 50: 50 | 90 days supply | Qty: 90 | Fill #0

## 2020-07-10 MED FILL — CARBIDOPA-LEVO ER 25-100 TA: 25-100 | 30 days supply | Qty: 60 | Fill #0

## 2020-07-10 MED FILL — SILDENAFIL CITRATE 100 MG T: 100 | 30 days supply | Qty: 6 | Fill #1

## 2020-08-03 ENCOUNTER — Other Ambulatory Visit (HOSPITAL_COMMUNITY): Payer: Self-pay | Admitting: Family Medicine

## 2020-08-03 MED FILL — HYDROCODON-APAP 5-325: 5-325 | 20 days supply | Qty: 20 | Fill #0

## 2020-08-03 MED FILL — GABAPENTIN 300 MG CAPSULE: 300 | 30 days supply | Qty: 30 | Fill #1

## 2020-08-12 MED FILL — traMADol HCL 50 MG TABS: 50 | 30 days supply | Qty: 60 | Fill #2

## 2020-08-24 MED FILL — CARBIDOPA-LEVO ER 25-100 TA: 25-100 | 30 days supply | Qty: 60 | Fill #1

## 2020-08-28 MED FILL — SILDENAFIL CITRATE 100 MG T: 100 | 30 days supply | Qty: 6 | Fill #0

## 2020-09-02 ENCOUNTER — Other Ambulatory Visit (HOSPITAL_COMMUNITY): Payer: Self-pay | Admitting: Physical Medicine and Rehabilitation

## 2020-09-02 MED FILL — HYDROCODON-APAP 5-325: 5-325 | 20 days supply | Qty: 20 | Fill #0

## 2020-09-08 MED FILL — traMADol HCL 50 MG TABS: 50 | 30 days supply | Qty: 60 | Fill #3

## 2020-09-09 MED FILL — CYCLOBENZAPRINE HCL 10 MG T: 10 | 30 days supply | Qty: 90 | Fill #0

## 2020-09-24 MED FILL — CARBIDOPA-LEVO ER 25-100 TA: 25-100 | 30 days supply | Qty: 60 | Fill #2

## 2020-09-25 MED FILL — GABAPENTIN 300 MG CAPSULE: 300 | 30 days supply | Qty: 30 | Fill #2

## 2020-09-28 ENCOUNTER — Ambulatory Visit: Payer: No Typology Code available for payment source | Admitting: Neurology

## 2020-10-07 ENCOUNTER — Other Ambulatory Visit (HOSPITAL_COMMUNITY): Payer: Self-pay | Admitting: Physical Medicine and Rehabilitation

## 2020-10-07 MED FILL — METOPROLOL SUCCINATE ER 50: 50 | 90 days supply | Qty: 90 | Fill #1

## 2020-10-07 MED FILL — HYDROCODON-APAP 5-325: 5-325 | 20 days supply | Qty: 20 | Fill #0

## 2020-10-13 MED FILL — traMADol HCL 50 MG TABS: 50 | 30 days supply | Qty: 60 | Fill #4

## 2020-10-13 MED FILL — SILDENAFIL CITRATE 100 MG T: 100 | 30 days supply | Qty: 6 | Fill #1

## 2020-10-27 MED FILL — CARBIDOPA-LEVO ER 25-100 TA: 25-100 | 30 days supply | Qty: 60 | Fill #3

## 2020-10-27 MED FILL — GABAPENTIN 300 MG CAPSULE: 300 | 30 days supply | Qty: 30 | Fill #3

## 2020-11-06 MED FILL — traMADol HCL 50 MG TABS: 50 | 30 days supply | Qty: 60 | Fill #5

## 2020-11-09 ENCOUNTER — Other Ambulatory Visit (HOSPITAL_COMMUNITY): Payer: Self-pay | Admitting: Family Medicine

## 2020-11-09 MED FILL — HYDROCODON-APAP 5-325: 5-325 | 20 days supply | Qty: 20 | Fill #0

## 2020-11-10 ENCOUNTER — Ambulatory Visit: Payer: No Typology Code available for payment source | Admitting: Neurology

## 2020-11-25 ENCOUNTER — Other Ambulatory Visit (HOSPITAL_COMMUNITY): Payer: Self-pay | Admitting: Family Medicine

## 2020-12-01 ENCOUNTER — Other Ambulatory Visit: Payer: Self-pay | Admitting: Neurology

## 2020-12-01 ENCOUNTER — Ambulatory Visit: Payer: No Typology Code available for payment source | Admitting: Neurology

## 2020-12-01 ENCOUNTER — Encounter: Payer: Self-pay | Admitting: Neurology

## 2020-12-01 VITALS — BP 144/87 | HR 66 | Ht 71.0 in | Wt 217.0 lb

## 2020-12-01 DIAGNOSIS — R269 Unspecified abnormalities of gait and mobility: Secondary | ICD-10-CM | POA: Diagnosis not present

## 2020-12-01 DIAGNOSIS — G20 Parkinson's disease: Secondary | ICD-10-CM

## 2020-12-01 DIAGNOSIS — M79602 Pain in left arm: Secondary | ICD-10-CM | POA: Diagnosis not present

## 2020-12-01 DIAGNOSIS — G4701 Insomnia due to medical condition: Secondary | ICD-10-CM | POA: Diagnosis not present

## 2020-12-01 DIAGNOSIS — G20A1 Parkinson's disease without dyskinesia, without mention of fluctuations: Secondary | ICD-10-CM

## 2020-12-01 DIAGNOSIS — G2581 Restless legs syndrome: Secondary | ICD-10-CM

## 2020-12-01 MED ORDER — CLONAZEPAM 0.5 MG PO TABS: ORAL_TABLET | ORAL | 2 refills | Status: DC

## 2020-12-01 MED ORDER — ROPINIROLE HCL 0.5 MG PO TABS: 0.5000 mg | ORAL_TABLET | Freq: Three times a day (TID) | ORAL | 5 refills | Status: DC

## 2020-12-01 MED FILL — ROPINIROLE HCL 0.5 MG TABS: 0.5 | 30 days supply | Qty: 90 | Fill #0

## 2020-12-01 MED FILL — clonazePAM 0.5 MG TABS: 0.5 | 30 days supply | Qty: 30 | Fill #0

## 2020-12-01 NOTE — Progress Notes (Signed)
[facility]  PATIENT: Roy Watson DOB: [birth year]  REFERRING DOCTOR OR PCP: Dr. Ouida Sills SOURCE: patient, notes from PCP  _________________________________   HISTORICAL  CHIEF COMPLAINT:  Chief Complaint  Patient presents with   New Patient (Initial Visit)    RM 13, alone. Paper referral from Frazier Richards, MD  (PCP) for Parkinsons. Carbidopa-;evodopay ineffective. Shakiness mainly in left arm. Sx started about a year ago in left arm and shoulder. Now in right side sometimes.     HISTORY OF PRESENT ILLNESS:  I had the pleasure of seeing your patient, Roy Watson, at [facility] for neurologic consultation regarding his left greater than right arm tremor and other symptoms.  He is a 60 year old right handed man who began to notice a tremor in his left hand about a year ago.   The tremor is less on the right.   The intensity has progressed.  He notes the tremor at both rest and intention.     His handwriting has changed - smaller and sloppier.   He has had difficulty doing some tasks like putting a belt on.      He feels he is tossing and turning a lot at night.   He feels anxiety only at night.   He has trouble with both sleep onset and maintenance.    He has had a few active dreams.   He felt he was kicking someone attacking him and nearly kicked his wife.   He has had 3-5 of these, all in the last year.     He feels his gait has changed and he feels he is stumbling forward on uneven surfaces.   He stumbles but no falls.   The stride is normal.    He feels he is moving slower.    Balance is worse if he looks up.   He will hold a wall when looking up.   He does not feel safe on a 6 foot ladder anymore.   He fatigues a little bit more quickly if more active.  He was placed on Sinemet CR by Dr. Ouida Sills but did not note a benefit and stopped    He was on for 2 months trial  His wife had noted mild cognitive issues.   He feels he has  trouble getting out some words.    MRI of the cervical spine 2015 showed DJD, worse at C5-C6 and C6-C7.  At C5-C6 mild spinal senosisand right > left foraminal narrowing.   At C6-C7 moderate-severe right foraminal narrowing.    REVIEW OF SYSTEMS: Constitutional: No fevers, chills, sweats, or change in appetite.  Fatigue Eyes: No visual changes, double vision, eye pain Ear, nose and throat: No hearing loss, ear pain, nasal congestion, sore throat Cardiovascular: No chest pain, palpitations Respiratory: No shortness of breath at rest or with exertion.   No wheezes GastrointestinaI: No nausea, vomiting, diarrhea, abdominal pain, fecal incontinence.  He has constipation. Genitourinary: No dysuria, urinary retention or frequency.  No nocturia. Musculoskeletal: No neck pain, back pain Integumentary: No rash, pruritus, skin lesions Neurological: as above Psychiatric: No depression at this time.  No anxiety Endocrine: No palpitations, diaphoresis, change in appetite, change in weigh or increased thirst Hematologic/Lymphatic: No anemia, purpura, petechiae. Allergic/Immunologic: No itchy/runny eyes, nasal congestion, recent allergic reactions, rashes  ALLERGIES: No Known Allergies  HOME MEDICATIONS:  Current Outpatient Medications:    CIALIS 20 MG tablet, , Disp: , Rfl: 1   clonazePAM (KLONOPIN) 0.5 MG tablet,  1/2 to one po qHS, Disp: 30 tablet, Rfl: 2   cyclobenzaprine (FLEXERIL) 10 MG tablet, Take 1 tablet (10 mg total) by mouth at bedtime., Disp: 30 tablet, Rfl: 5   HYDROcodone-acetaminophen (NORCO/VICODIN) 5-325 MG tablet, Take 1-2 tablets by mouth every 4 (four) hours as needed for moderate pain., Disp: 20 tablet, Rfl: 0   ibuprofen (ADVIL,MOTRIN) 200 MG tablet, Take 800 mg by mouth every 6 (six) hours as needed., Disp: , Rfl:    imipramine (TOFRANIL) 25 MG tablet, Take 1 tablet (25 mg total) by mouth at bedtime., Disp: 30 tablet, Rfl: 5   meclizine (ANTIVERT) 25 MG tablet, Take  1 tablet (25 mg total) by mouth 3 (three) times daily as needed for dizziness or nausea., Disp: 30 tablet, Rfl: 1   metoprolol succinate (TOPROL-XL) 100 MG 24 hr tablet, Take 100 mg by mouth daily. Take with or immediately following a meal., Disp: , Rfl:    pantoprazole (PROTONIX) 20 MG tablet, Take 20 mg by mouth daily., Disp: , Rfl:    pantoprazole (PROTONIX) 40 MG tablet, TAKE 1 TABLET BY MOUTH TWICE DAILY, Disp: 120 tablet, Rfl: 4   polyethylene glycol powder (GLYCOLAX/MIRALAX) powder, 2 cap fulls in a full glass of water, three times a day, for 5 days., Disp: 255 g, Rfl: 0   predniSONE (STERAPRED UNI-PAK 21 TAB) 10 MG (21) TBPK tablet, Take over 6 days as directed, Disp: 21 tablet, Rfl: 0   rOPINIRole (REQUIP) 0.5 MG tablet, Take 1 tablet (0.5 mg total) by mouth 3 (three) times daily., Disp: 90 tablet, Rfl: 5   senna (SENOKOT) 8.6 MG TABS tablet, Take 2 tablets (17.2 mg total) by mouth 2 (two) times daily., Disp: 120 each, Rfl: 0   traMADol (ULTRAM) 50 MG tablet, Take 50 mg by mouth every 6 (six) hours as needed for moderate pain. , Disp: , Rfl:   PAST MEDICAL HISTORY: Past Medical History:  Diagnosis Date   Hypertension    Low back pain    has had 2 back surgeries    PAST SURGICAL HISTORY: Past Surgical History:  Procedure Laterality Date   APPENDECTOMY     BACK SURGERY     x2 (1986, 2000)   KNEE ARTHROPLASTY Left     FAMILY HISTORY: Family History  Problem Relation Age of Onset   Hypertension Mother    Transient ischemic attack Mother    COPD Father    Hypertension Father    Diverticulitis Father     SOCIAL HISTORY:  Social History   Socioeconomic History   Marital status: Married    Spouse name: Olin Hauser   Number of children: 2   Years of education: 12   Highest education level: Not on file  Occupational History   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   Tobacco Use   Smoking status: Never Smoker   Smokeless tobacco: Never Used   Substance and Sexual Activity   Alcohol use: Yes   Drug use: No   Sexual activity: Not on file  Other Topics Concern   Not on file  Social History Narrative   Right handed   Lives   Caffeine use: 1 per day   Social Determinants of Health   Financial Resource Strain: Not on file  Food Insecurity: Not on file  Transportation Needs: Not on file  Physical Activity: Not on file  Stress: Not on file  Social Connections: Not on file  Intimate Partner Violence: Not on file     PHYSICAL EXAM  Vitals:  12/01/20 1117  BP: (!) 144/87  Pulse: 66  Weight: 217 lb (98.4 kg)  Height: 5\' 11"  (1.803 m)    Body mass index is 30.27 kg/m.   General: The patient is well-developed and well-nourished and in no acute distress  HEENT:  Head is [place]/AT.  Sclera are anicteric.  Funduscopic exam shows normal optic discs and retinal vessels.  Neck: No carotid bruits are noted.  The neck is nontender.  Cardiovascular: The heart has a regular rate and rhythm with a normal S1 and S2. There were no murmurs, gallops or rubs.    Skin: Extremities are without rash or  edema.  Musculoskeletal:  Back is nontender  Neurologic Exam  Mental status: The patient is alert and oriented x 3 at the time of the examination. The patient has apparent normal recent and remote memory, with an apparently normal attention span and concentration ability.   Speech is normal.  Cranial nerves: Extraocular movements are full. Pupils are equal, round, and reactive to light and accomodation.  Visual fields are full.  Facial symmetry is present. There is good facial sensation to soft touch bilaterally.Facial strength is normal.  Trapezius and sternocleidomastoid strength is normal. No dysarthria is noted.  The tongue is midline, and the patient has symmetric elevation of the soft palate. No obvious hearing deficits are noted.  Motor: He appears bradykinetic.  Mild increase tone, with slight cog-wheeling on the left,  normal on right.   Muscle bulk is normal.   Tone is normal. Strength is  5 / 5 in all 4 extremities.   Sensory: Sensory testing is intact to pinprick, soft touch and vibration sensation in all 4 extremities.  Coordination: Cerebellar testing reveals good finger-nose-finger and heel-to-shin bilaterally.  Gait and station: Station is normal.   Gait is normal but he takes 4 steps to turn 180 degrees.  Minimal retropulsion.   Tandem gait is normal. Romberg is negative.   Reflexes: Deep tendon reflexes are symmetric and normal in the arms but increased in the knees with spread.  There is no ankle clonus..   Plantar responses are flexor.    DIAGNOSTIC DATA (LABS, IMAGING, TESTING) - I reviewed patient records, labs, notes, testing and imaging myself where available.  Lab Results  Component Value Date   WBC 8.6 08/18/2016   HGB 15.7 08/18/2016   HCT 45.6 08/18/2016   MCV 89.4 08/18/2016   PLT 301 08/18/2016      Component Value Date/Time   NA 139 08/18/2016 1833   K 3.6 08/18/2016 1833   CL 105 08/18/2016 1833   CO2 22 08/18/2016 1833   GLUCOSE 88 08/18/2016 1833   BUN 12 08/18/2016 1833   CREATININE 1.00 07/24/2018 1602   CALCIUM 9.8 08/18/2016 1833   PROT 7.1 08/18/2016 1833   ALBUMIN 4.6 08/18/2016 1833   AST 29 08/18/2016 1833   ALT 33 08/18/2016 1833   ALKPHOS 91 08/18/2016 1833   BILITOT 0.5 08/18/2016 1833   GFRNONAA >60 08/18/2016 1833   GFRAA >60 08/18/2016 1833       ASSESSMENT AND PLAN  Gait disturbance - Plan: MR CERVICAL SPINE WO CONTRAST  Parkinson's disease ([place]) - Plan: MR BRAIN WO CONTRAST  Pain of left upper extremity - Plan: MR CERVICAL SPINE WO CONTRAST  Insomnia due to medical condition  Restless leg syndrome   In summary, Mr. Wilmer is a 60 year old man who has developed a tremor in the arm, left greater than right and also notes some mild difficulties  with gait/balance.  Additionally, he will have pain in the left arm, especially at night.  He  appears to have a parkinsonian syndrome.  Furthermore, he has a REM behavior disorder, restless leg syndrome and insomnia.    Most people with early Parkinson's disease responding, at least partially, to Sinemet.  The lack of response is more typical for one of the parkinsonian syndromes such as Lewy body disease or multisystem atrophy or vascular parkinsonism.  Additionally he has hyperreflexia and this could be seen with cervical spinal stenosis that could also cause some gait disturbance..  To help sort the different possibilities out, we will check an MRI of the brain and cervical spine.  This will allow [place] to determine if there are ischemic changes, atrophy or myelopathy.  To help with the symptoms, I will start clonazepam at bedtime for the REM behavior disorder.  This should also help the insomnia and the nighttime anxiety.  Additionally, I will have him start ropinirole to see if that helps the tremor and gait.  He is advised to stay active.  Thank you for asking me to see Mr. Rushing.  Please let me know if I can be of further assistance with him or other patients in the future.    Tamikia Chowning A. Felecia Shelling, MD, [facility] 9/79/1504, 1:36 PM Certified in Neurology, Clinical Neurophysiology, Sleep Medicine and Neuroimaging  [facility] [address] [place], [place] [address] [phone]

## 2020-12-02 ENCOUNTER — Telehealth: Payer: Self-pay | Admitting: Neurology

## 2020-12-02 NOTE — Telephone Encounter (Signed)
LVM for pt to call back about scheduling mri  [facility] auth: [id number] (exp. 12/08/20 to 01/07/21)

## 2020-12-03 ENCOUNTER — Other Ambulatory Visit (HOSPITAL_COMMUNITY): Payer: Self-pay | Admitting: Internal Medicine

## 2020-12-03 MED FILL — CYCLOBENZAPRINE HCL 10 MG T: 10 | 30 days supply | Qty: 90 | Fill #0

## 2020-12-03 MED FILL — GABAPENTIN 300 MG CAPSULE: 300 | 30 days supply | Qty: 30 | Fill #4

## 2020-12-07 ENCOUNTER — Other Ambulatory Visit (HOSPITAL_COMMUNITY): Payer: Self-pay | Admitting: Internal Medicine

## 2020-12-07 MED FILL — SILDENAFIL CITRATE 100 MG T: 100 | 30 days supply | Qty: 6 | Fill #0

## 2020-12-10 MED FILL — HYDROCODON-APAP 5-325: 5-325 | 20 days supply | Qty: 20 | Fill #0

## 2020-12-28 ENCOUNTER — Other Ambulatory Visit: Payer: Self-pay

## 2020-12-28 MED FILL — Clonazepam Tab 0.5 MG: ORAL | 30 days supply | Qty: 30 | Fill #0 | Status: CN

## 2020-12-28 MED FILL — Tramadol HCl Tab 50 MG: ORAL | 30 days supply | Qty: 60 | Fill #0 | Status: AC

## 2020-12-29 ENCOUNTER — Other Ambulatory Visit: Payer: Self-pay

## 2020-12-29 MED FILL — Clonazepam Tab 0.5 MG: ORAL | 30 days supply | Qty: 30 | Fill #0 | Status: AC

## 2020-12-30 NOTE — Telephone Encounter (Signed)
Pt called and LVM regarding scheduling his MRI. Please call back as soon as available.

## 2021-01-04 ENCOUNTER — Other Ambulatory Visit (HOSPITAL_COMMUNITY): Payer: Self-pay

## 2021-01-04 MED ORDER — ALPRAZOLAM 0.5 MG PO TABS
ORAL_TABLET | ORAL | 0 refills | Status: DC
  Filled 2021-01-04: qty 2, 1d supply, fill #0

## 2021-01-04 MED FILL — Gabapentin Cap 300 MG: ORAL | 30 days supply | Qty: 30 | Fill #0 | Status: AC

## 2021-01-04 NOTE — Addendum Note (Signed)
Addended by: Marcial Pacas on: 01/04/2021 11:34 AM   Modules accepted: Orders

## 2021-01-04 NOTE — Addendum Note (Signed)
Addended by: Roberts Gaudy L on: 01/04/2021 11:02 AM   Modules accepted: Orders

## 2021-01-04 NOTE — Telephone Encounter (Signed)
no the covid questions MR Brain wo contrast & MR Cervical spine wo contrast Dr. Scot [facility] Josem Kaufmann: [id number] (exp. 12/08/20 to 01/07/21). Patient is scheduled at [facility] for 01/05/21.   Patient also informed me he is slightly claustrophobic and would like something to help him. He is aware to have a driver.

## 2021-01-04 NOTE — Telephone Encounter (Signed)
Meds ordered this encounter  Medications  . ALPRAZolam (XANAX) 0.5 MG tablet    Sig: Take 1 tablet by mouth 30-60 min prior to test. Can take additional tablet at time of test if needed    Dispense:  2 tablet    Refill:  0    Dr. Felecia Shelling out, Dr. Krista Blue covering MD

## 2021-01-05 ENCOUNTER — Ambulatory Visit: Payer: No Typology Code available for payment source

## 2021-01-05 ENCOUNTER — Other Ambulatory Visit: Payer: Self-pay

## 2021-01-05 DIAGNOSIS — R269 Unspecified abnormalities of gait and mobility: Secondary | ICD-10-CM | POA: Diagnosis not present

## 2021-01-05 DIAGNOSIS — G20 Parkinson's disease: Secondary | ICD-10-CM

## 2021-01-05 DIAGNOSIS — M79602 Pain in left arm: Secondary | ICD-10-CM

## 2021-01-06 ENCOUNTER — Other Ambulatory Visit (HOSPITAL_COMMUNITY): Payer: Self-pay

## 2021-01-06 MED ORDER — METOPROLOL SUCCINATE ER 50 MG PO TB24
50.0000 mg | ORAL_TABLET | Freq: Every day | ORAL | 1 refills | Status: DC
  Filled 2021-01-06: qty 90, 90d supply, fill #0

## 2021-01-06 MED FILL — Metoprolol Succinate Tab ER 24HR 50 MG (Tartrate Equiv): ORAL | 3 days supply | Qty: 3 | Fill #0 | Status: AC

## 2021-01-06 MED FILL — Ropinirole Hydrochloride Tab 0.5 MG: ORAL | 30 days supply | Qty: 90 | Fill #0 | Status: AC

## 2021-01-08 ENCOUNTER — Other Ambulatory Visit (HOSPITAL_COMMUNITY): Payer: Self-pay

## 2021-01-11 ENCOUNTER — Other Ambulatory Visit (HOSPITAL_COMMUNITY): Payer: Self-pay

## 2021-01-11 MED FILL — Hydrocodone-Acetaminophen Tab 5-325 MG: ORAL | 20 days supply | Qty: 20 | Fill #0 | Status: AC

## 2021-01-20 MED FILL — Tramadol HCl Tab 50 MG: ORAL | 30 days supply | Qty: 60 | Fill #1 | Status: CN

## 2021-01-21 ENCOUNTER — Other Ambulatory Visit (HOSPITAL_COMMUNITY): Payer: Self-pay

## 2021-01-22 ENCOUNTER — Other Ambulatory Visit: Payer: Self-pay

## 2021-01-22 MED FILL — Tramadol HCl Tab 50 MG: ORAL | 30 days supply | Qty: 60 | Fill #1 | Status: AC

## 2021-01-25 ENCOUNTER — Other Ambulatory Visit: Payer: Self-pay

## 2021-01-27 ENCOUNTER — Other Ambulatory Visit: Payer: Self-pay

## 2021-01-27 MED FILL — Clonazepam Tab 0.5 MG: ORAL | 30 days supply | Qty: 30 | Fill #1 | Status: AC

## 2021-01-27 MED FILL — Sildenafil Citrate Tab 100 MG: ORAL | 30 days supply | Qty: 6 | Fill #0 | Status: AC

## 2021-02-02 MED FILL — Gabapentin Cap 300 MG: ORAL | 30 days supply | Qty: 30 | Fill #1 | Status: CN

## 2021-02-03 ENCOUNTER — Other Ambulatory Visit (HOSPITAL_COMMUNITY): Payer: Self-pay

## 2021-02-05 ENCOUNTER — Other Ambulatory Visit (HOSPITAL_COMMUNITY): Payer: Self-pay

## 2021-02-05 ENCOUNTER — Other Ambulatory Visit: Payer: Self-pay

## 2021-02-05 MED FILL — Gabapentin Cap 300 MG: ORAL | 30 days supply | Qty: 30 | Fill #0 | Status: AC

## 2021-02-11 ENCOUNTER — Other Ambulatory Visit: Payer: Self-pay

## 2021-02-11 ENCOUNTER — Other Ambulatory Visit (HOSPITAL_COMMUNITY): Payer: Self-pay

## 2021-02-11 MED FILL — Hydrocodone-Acetaminophen Tab 5-325 MG: ORAL | 20 days supply | Qty: 20 | Fill #0 | Status: AC

## 2021-02-11 MED FILL — Hydrocodone-Acetaminophen Tab 5-325 MG: ORAL | 20 days supply | Qty: 20 | Fill #0 | Status: CN

## 2021-02-15 ENCOUNTER — Other Ambulatory Visit (HOSPITAL_COMMUNITY): Payer: Self-pay

## 2021-02-18 ENCOUNTER — Other Ambulatory Visit (HOSPITAL_COMMUNITY): Payer: Self-pay

## 2021-02-19 ENCOUNTER — Other Ambulatory Visit (HOSPITAL_COMMUNITY): Payer: Self-pay

## 2021-02-19 ENCOUNTER — Other Ambulatory Visit: Payer: Self-pay

## 2021-02-23 ENCOUNTER — Other Ambulatory Visit (HOSPITAL_BASED_OUTPATIENT_CLINIC_OR_DEPARTMENT_OTHER): Payer: Self-pay

## 2021-02-24 ENCOUNTER — Other Ambulatory Visit: Payer: Self-pay | Admitting: Neurology

## 2021-02-24 ENCOUNTER — Other Ambulatory Visit (HOSPITAL_COMMUNITY): Payer: Self-pay

## 2021-02-24 ENCOUNTER — Other Ambulatory Visit: Payer: Self-pay

## 2021-02-24 ENCOUNTER — Other Ambulatory Visit: Payer: Self-pay | Admitting: *Deleted

## 2021-02-24 ENCOUNTER — Telehealth: Payer: Self-pay | Admitting: Neurology

## 2021-02-24 MED ORDER — CLONAZEPAM 0.5 MG PO TABS
ORAL_TABLET | Freq: Every day | ORAL | 2 refills | Status: DC
  Filled 2021-02-24: qty 30, 30d supply, fill #0

## 2021-02-24 MED ORDER — ROPINIROLE HCL 0.5 MG PO TABS: ORAL_TABLET | Freq: Three times a day (TID) | ORAL | 5 refills | Status: DC

## 2021-02-24 MED ORDER — CLONAZEPAM 0.5 MG PO TABS: ORAL_TABLET | Freq: Every day | ORAL | 2 refills | Status: DC

## 2021-02-24 MED ORDER — CYCLOBENZAPRINE HCL 10 MG PO TABS
ORAL_TABLET | Freq: Three times a day (TID) | ORAL | 0 refills | Status: DC | PRN
  Filled 2021-02-24: qty 90, 30d supply, fill #0

## 2021-02-24 MED FILL — Ropinirole Hydrochloride Tab 0.5 MG: ORAL | 30 days supply | Qty: 90 | Fill #0 | Status: CN

## 2021-02-24 MED FILL — Tramadol HCl Tab 50 MG: ORAL | 30 days supply | Qty: 60 | Fill #0 | Status: CN

## 2021-02-24 MED FILL — Ropinirole Hydrochloride Tab 0.5 MG: ORAL | 9 days supply | Qty: 27 | Fill #0 | Status: AC

## 2021-02-24 NOTE — Telephone Encounter (Addendum)
Pt called stating that he has changed his insurance company and is needing all of his medications transferred over to [employer] number [phone] in [place]

## 2021-02-24 NOTE — Progress Notes (Signed)
Meds ordered this encounter  Medications   rOPINIRole (REQUIP) 0.5 MG tablet    Sig: TAKE 1 TABLET BY MOUTH 3 TIMES DAILY    Dispense:  90 tablet    Refill:  5   clonazePAM (KLONOPIN) 0.5 MG tablet    Sig: TAKE 1/2 TO 1 TABLET BY MOUTH AT BEDTIME.    Dispense:  30 tablet    Refill:  2    Dr. Felecia Shelling out, Dr. Krista Blue covering MD

## 2021-02-24 NOTE — Telephone Encounter (Signed)
Reviewed pt chart. Appears Dr. Felecia Shelling writing requip/clonazepam. E-scribed requip to pharmacy and sent request for clonazepam to Dr. Krista Blue to e-scribe.

## 2021-02-25 ENCOUNTER — Other Ambulatory Visit: Payer: Self-pay

## 2021-03-01 ENCOUNTER — Other Ambulatory Visit: Payer: Self-pay

## 2021-03-01 MED FILL — Tramadol HCl Tab 50 MG: ORAL | 30 days supply | Qty: 60 | Fill #0 | Status: AC

## 2021-03-10 ENCOUNTER — Ambulatory Visit: Payer: No Typology Code available for payment source | Admitting: Neurology

## 2021-03-10 ENCOUNTER — Encounter: Payer: Self-pay | Admitting: Neurology

## 2021-03-10 ENCOUNTER — Other Ambulatory Visit: Payer: Self-pay

## 2021-03-10 VITALS — BP 137/82 | HR 66 | Ht 71.0 in | Wt 203.0 lb

## 2021-03-10 DIAGNOSIS — G4752 REM sleep behavior disorder: Secondary | ICD-10-CM

## 2021-03-10 DIAGNOSIS — G20 Parkinson's disease: Secondary | ICD-10-CM | POA: Diagnosis not present

## 2021-03-10 DIAGNOSIS — G4701 Insomnia due to medical condition: Secondary | ICD-10-CM | POA: Diagnosis not present

## 2021-03-10 DIAGNOSIS — R269 Unspecified abnormalities of gait and mobility: Secondary | ICD-10-CM | POA: Diagnosis not present

## 2021-03-10 DIAGNOSIS — G2581 Restless legs syndrome: Secondary | ICD-10-CM | POA: Diagnosis not present

## 2021-03-10 MED ORDER — ROPINIROLE HCL 1 MG PO TABS
1.0000 mg | ORAL_TABLET | Freq: Three times a day (TID) | ORAL | 11 refills | Status: DC
  Filled 2021-03-10: qty 90, 30d supply, fill #0
  Filled 2021-05-04: qty 90, 30d supply, fill #1
  Filled 2021-06-09: qty 90, 30d supply, fill #0

## 2021-03-10 MED ORDER — CLONAZEPAM 0.5 MG PO TABS
ORAL_TABLET | Freq: Every day | ORAL | 5 refills | Status: DC
  Filled 2021-03-10: qty 30, 30d supply, fill #0
  Filled 2021-04-09: qty 30, 30d supply, fill #1
  Filled 2021-05-04: qty 30, 30d supply, fill #2
  Filled 2021-06-07: qty 30, 30d supply, fill #0
  Filled 2021-06-30 – 2021-07-05 (×2): qty 30, 30d supply, fill #1

## 2021-03-10 NOTE — Progress Notes (Signed)
[facility]  PATIENT: Roy Watson DOB: 19-Jun-1961  REFERRING DOCTOR OR PCP: Dr. Ouida Sills SOURCE: patient, notes from PCP  _________________________________   HISTORICAL  CHIEF COMPLAINT:  Chief Complaint  Patient presents with   Follow-up    RM 12. Last seen 12/01/20. PD/RLS. F/u post MRI. C/o of balance concerns and tremors on left side. Decreased with requip but still present.     HISTORY OF PRESENT ILLNESS:  Roy Watson is a 61 y.o. man with tremor and gait disturbance  UPDATE 03/10/2021: He continues to note a tremor in the left hand.   Requip has helped some.   He notes the tremor at Watson rest and intention.     His handwriting has changed - smaller and sloppier.   He has had difficulty doing some tasks like putting a belt on.    No change in voice or swallowing.    Gait is off balanced ad he is not comfortable on ladders.  He was working in maintenance.      He stumbles but no falls.   The stride is mildly reduced with some shuffling.  Marland Kitchen    He feels he is moving slower.    Balance is worse if he looks up.   He will hold a wall when looking up.   He does not feel safe on a 6 foot ladder anymore.   He fatigues a little bit more quickly if more active.  He was placed on Sinemet CR by Dr. Ouida Sills but did not note a benefit and stopped    He was on for 2 months trial.   Requi has helped the trmor some.   I  He still has a lot of  tossing and turning a lot at night.   He feels anxiety only at night.   He has trouble with Watson sleep onset and maintenance.    He has had a few active dreams, but none since the last visit when clonazepam was started.    He felt he was kicking someone attacking him and nearly kicked his wife.   He has had 4-5 of these total.  His wife had noted mild cognitive issues.   He feels he has trouble getting out some words.     IMAGING: MRI cervical spine 01/06/2021:  The spinal cord appears normal.     At C5-C6 there is mild spinal  stenosis due to stable degenerative changes causing mild foraminal narrowing but no nerve root compression.   At C6-C7, there is mild spinal stenosis due to stable degenerative changes causing moderately severe right and mild to moderate left foraminal narrowing.  There is potential for right C7 nerve root compression.  MRI brain 01/06/2021:  Brain parenchyma appears normal for age with just a single punctate T2/FLAIR hyperintense focus in the right frontal lobe, also seen on the previous MRI.  It likely represents a solitary focus of chronic microvascular ischemic change and is not significant at this age.   Prior bilateral antrostomy and ethmoidectomy surgery.  There is severe residual inflammatory disease, more than was noted in 2017.Marland Kitchen   REVIEW OF SYSTEMS: Constitutional: No fevers, chills, sweats, or change in appetite.  Fatigue Eyes: No visual changes, double vision, eye pain Ear, nose and throat: No hearing loss, ear pain, nasal congestion, sore throat Cardiovascular: No chest pain, palpitations Respiratory:  No shortness of breath at rest or with exertion.   No wheezes GastrointestinaI: No nausea, vomiting, diarrhea, abdominal pain, fecal incontinence.  He  has constipation. Genitourinary:  No dysuria, urinary retention or frequency.  No nocturia. Musculoskeletal:  No neck pain, back pain Integumentary: No rash, pruritus, skin lesions Neurological: as above Psychiatric: No depression at this time.  No anxiety Endocrine: No palpitations, diaphoresis, change in appetite, change in weigh or increased thirst Hematologic/Lymphatic:  No anemia, purpura, petechiae. Allergic/Immunologic: No itchy/runny eyes, nasal congestion, recent allergic reactions, rashes  ALLERGIES: No Known Allergies  HOME MEDICATIONS:  Current Outpatient Medications:    ALPRAZolam (XANAX) 0.5 MG tablet, Take 1 tablet by mouth 30-60 min prior to test. Can take additional tablet at time of test if needed, Disp: 2 tablet,  Rfl: 0   Carbidopa-Levodopa ER (SINEMET CR) 25-100 MG tablet controlled release, TAKE 1 TABLET BY MOUTH TWICE DAILY, Disp: 60 tablet, Rfl: 11   CIALIS 20 MG tablet, , Disp: , Rfl: 1   cyclobenzaprine (FLEXERIL) 10 MG tablet, TAKE 1 TABLET BY MOUTH THREE TIMES DAILY AS NEEDED FOR MUSCLE SPASMS, Disp: 90 tablet, Rfl: 0   gabapentin (NEURONTIN) 300 MG capsule, TAKE 1 CAPSULE BY MOUTH NIGHTLY, Disp: 30 capsule, Rfl: 11   HYDROcodone-acetaminophen (NORCO/VICODIN) 5-325 MG tablet, TAKE 1 TABLET BY MOUTH AT BEDTIME AS NEEDED (FILL 01/08/21), Disp: 20 tablet, Rfl: 0   ibuprofen (ADVIL,MOTRIN) 200 MG tablet, Take 800 mg by mouth every 6 (six) hours as needed., Disp: , Rfl:    imipramine (TOFRANIL) 25 MG tablet, Take 1 tablet (25 mg total) by mouth at bedtime., Disp: 30 tablet, Rfl: 5   meclizine (ANTIVERT) 25 MG tablet, Take 1 tablet (25 mg total) by mouth 3 (three) times daily as needed for dizziness or nausea., Disp: 30 tablet, Rfl: 1   metoprolol succinate (TOPROL-XL) 50 MG 24 hr tablet, Take 1 tablet (50 mg total) by mouth once daily, Disp: 90 tablet, Rfl: 1   pantoprazole (PROTONIX) 20 MG tablet, Take 20 mg by mouth daily., Disp: , Rfl:    polyethylene glycol powder (GLYCOLAX/MIRALAX) powder, 2 cap fulls in a full glass of water, three times a day, for 5 days., Disp: 255 g, Rfl: 0   rOPINIRole (REQUIP) 1 MG tablet, Take 1 tablet (1 mg total) by mouth 3 (three) times daily., Disp: 90 tablet, Rfl: 11   senna (SENOKOT) 8.6 MG TABS tablet, Take 2 tablets (17.2 mg total) by mouth 2 (two) times daily., Disp: 120 each, Rfl: 0   sildenafil (VIAGRA) 100 MG tablet, TAKE 1 TABLET BY MOUTH ONCE DAILY AS NEEDED FOR ERECTILE DYSFUNCTION, Disp: 6 tablet, Rfl: 1   traMADol (ULTRAM) 50 MG tablet, TAKE 1 TABLET BY MOUTH 2 TIMES DAILY AS NEEDED FOR PAIN, Disp: 60 tablet, Rfl: 5   clonazePAM (KLONOPIN) 0.5 MG tablet, TAKE 1/2 TO 1 TABLET BY MOUTH AT BEDTIME., Disp: 30 tablet, Rfl: 5  PAST MEDICAL HISTORY: Past Medical  History:  Diagnosis Date   Hypertension    Low back pain    has had 2 back surgeries    PAST SURGICAL HISTORY: Past Surgical History:  Procedure Laterality Date   APPENDECTOMY     BACK SURGERY     x2 (1986, 2000)   KNEE ARTHROPLASTY Left     FAMILY HISTORY: Family History  Problem Relation Age of Onset   Hypertension Mother    Transient ischemic attack Mother    COPD Father    Hypertension Father    Diverticulitis Father     SOCIAL HISTORY:  Social History   Socioeconomic History   Marital status: Married    Spouse name:  Olin Hauser   Number of children: 2   Years of education: 12   Highest education level: Not on file  Occupational History   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   Tobacco Use   Smoking status: Never   Smokeless tobacco: Never  Substance and Sexual Activity   Alcohol use: Yes   Drug use: No   Sexual activity: Not on file  Other Topics Concern   Not on file  Social History Narrative   Right handed   Lives   Caffeine use: 1 per day   Social Determinants of Health   Financial Resource Strain: Not on file  Food Insecurity: Not on file  Transportation Needs: Not on file  Physical Activity: Not on file  Stress: Not on file  Social Connections: Not on file  Intimate Partner Violence: Not on file     PHYSICAL EXAM  Vitals:   03/10/21 1509  BP: 137/82  Pulse: 66  Weight: 203 lb (92.1 kg)  Height: 5\' 11"  (1.803 m)    Body mass index is 28.31 kg/m.   General: The patient is well-developed and well-nourished and in no acute distress  HEENT:  Head is [place]/AT.  Sclera are anicteric.    Skin: Extremities are without rash or  edema.  Neurologic Exam  Mental status: The patient is alert and oriented x 3 at the time of the examination. The patient has apparent normal recent and remote memory, with an apparently normal attention span and concentration ability.   Speech is normal.  Cranial nerves: Extraocular movements are full. Facial  strength is normal.  Trapezius and sternocleidomastoid strength is normal. No dysarthria is noted.  Hearing seems normal.  Motor: He appears bradykinetic.  Mild increase tone, with slight cog-wheeling on the left, normal on right.   Muscle bulk is normal.   Tone is normal. Strength is  5 / 5 in all 4 extremities.   Sensory: Sensory testing is intact to pinprick, soft touch and vibration sensation in all 4 extremities.  Coordination: Cerebellar testing reveals good finger-nose-finger and heel-to-shin bilaterally.  Gait and station: Station is normal.   Gait is the same and  but he takes 4 steps to turn 180 degrees.  Minimal retropulsion.   Tandem gait is normal. Romberg is negative.   Reflexes: Deep tendon reflexes are symmetric and normal in the arms but increased in the knees with spread.  He is not having ankle clonus.  Plantar responses are flexor.    DIAGNOSTIC DATA (LABS, IMAGING, TESTING) - I reviewed patient records, labs, notes, testing and imaging myself where available.  Lab Results  Component Value Date   WBC 8.6 08/18/2016   HGB 15.7 08/18/2016   HCT 45.6 08/18/2016   MCV 89.4 08/18/2016   PLT 301 08/18/2016      Component Value Date/Time   NA 139 08/18/2016 1833   K 3.6 08/18/2016 1833   CL 105 08/18/2016 1833   CO2 22 08/18/2016 1833   GLUCOSE 88 08/18/2016 1833   BUN 12 08/18/2016 1833   CREATININE 1.00 07/24/2018 1602   CALCIUM 9.8 08/18/2016 1833   PROT 7.1 08/18/2016 1833   ALBUMIN 4.6 08/18/2016 1833   AST 29 08/18/2016 1833   ALT 33 08/18/2016 1833   ALKPHOS 91 08/18/2016 1833   BILITOT 0.5 08/18/2016 1833   GFRNONAA >60 08/18/2016 1833   GFRAA >60 08/18/2016 1833       ASSESSMENT AND PLAN  Parkinson's disease (HCC)  Restless leg syndrome - Plan: Ferritin  Gait disturbance  Insomnia due to medical condition  REM behavioral disorder   He appears to have a parkinsonian syndrome.  He did not get a benefit from Sinemet but has had some  benefit from ropinirole.  Increase the dose of ropinirole to 1 mg p.o. 3 times daily. Continue clonazepam for REM behavior disorder. Due to his poor balance, reduced gait and other symptoms, he is unable to work. He will return to see [place] in 6 months or sooner for new or worsening neurologic symptoms.  He is planning on moving to [place] soon.  He has family that works at the hospital there and we will get him a name of a doctor to be referred today.    Yousra Ivens A. Felecia Shelling, MD, [facility] 6/57/9038, 3:33 PM Certified in Neurology, Clinical Neurophysiology, Sleep Medicine and Neuroimaging  [facility] [address] [place], [place] [address] [phone]

## 2021-03-11 LAB — FERRITIN: Ferritin: 264 ng/mL (ref 30–400)

## 2021-03-12 ENCOUNTER — Other Ambulatory Visit: Payer: Self-pay

## 2021-03-12 MED ORDER — HYDROCODONE-ACETAMINOPHEN 5-325 MG PO TABS
ORAL_TABLET | ORAL | 0 refills | Status: DC
  Filled 2021-03-22: qty 20, 20d supply, fill #0

## 2021-03-12 MED ORDER — HYDROCODONE-ACETAMINOPHEN 5-325 MG PO TABS
ORAL_TABLET | ORAL | 0 refills | Status: DC
  Filled 2021-04-22: qty 20, 20d supply, fill #0

## 2021-03-12 MED ORDER — HYDROCODONE-ACETAMINOPHEN 5-325 MG PO TABS
ORAL_TABLET | ORAL | 0 refills | Status: DC
  Filled 2021-05-24: qty 20, 20d supply, fill #0

## 2021-03-22 ENCOUNTER — Other Ambulatory Visit (HOSPITAL_COMMUNITY): Payer: Self-pay

## 2021-03-23 ENCOUNTER — Other Ambulatory Visit: Payer: Self-pay

## 2021-03-25 ENCOUNTER — Other Ambulatory Visit: Payer: Self-pay

## 2021-03-26 ENCOUNTER — Other Ambulatory Visit: Payer: Self-pay

## 2021-03-29 ENCOUNTER — Other Ambulatory Visit: Payer: Self-pay

## 2021-03-29 MED ORDER — SILDENAFIL CITRATE 100 MG PO TABS
ORAL_TABLET | ORAL | 5 refills | Status: DC
  Filled 2021-03-29: qty 6, 30d supply, fill #0
  Filled 2021-05-04: qty 6, 30d supply, fill #1
  Filled 2021-07-02: qty 6, 30d supply, fill #0

## 2021-04-02 ENCOUNTER — Other Ambulatory Visit: Payer: Self-pay

## 2021-04-02 MED FILL — Tramadol HCl Tab 50 MG: ORAL | 30 days supply | Qty: 60 | Fill #1 | Status: AC

## 2021-04-09 ENCOUNTER — Other Ambulatory Visit: Payer: Self-pay

## 2021-04-15 ENCOUNTER — Other Ambulatory Visit: Payer: Self-pay

## 2021-04-15 MED ORDER — GABAPENTIN 300 MG PO CAPS
300.0000 mg | ORAL_CAPSULE | Freq: Every day | ORAL | 11 refills | Status: AC
  Filled 2021-04-15: qty 30, 30d supply, fill #0
  Filled 2021-05-12: qty 30, 30d supply, fill #1
  Filled 2021-06-07: qty 30, 30d supply, fill #2
  Filled 2021-06-30: qty 30, 30d supply, fill #3
  Filled 2021-06-30: qty 30, 30d supply, fill #0

## 2021-04-22 ENCOUNTER — Other Ambulatory Visit: Payer: Self-pay

## 2021-04-26 DIAGNOSIS — Z0289 Encounter for other administrative examinations: Secondary | ICD-10-CM

## 2021-04-28 ENCOUNTER — Telehealth: Payer: Self-pay | Admitting: *Deleted

## 2021-04-28 NOTE — Telephone Encounter (Signed)
Pt Social Security form ready for p/u

## 2021-05-04 ENCOUNTER — Other Ambulatory Visit (HOSPITAL_COMMUNITY): Payer: Self-pay

## 2021-05-04 ENCOUNTER — Other Ambulatory Visit: Payer: Self-pay

## 2021-05-05 ENCOUNTER — Other Ambulatory Visit: Payer: Self-pay

## 2021-05-05 MED ORDER — PANTOPRAZOLE SODIUM 40 MG PO TBEC
DELAYED_RELEASE_TABLET | ORAL | 1 refills | Status: AC
  Filled 2021-05-05 – 2021-05-12 (×2): qty 180, 90d supply, fill #0

## 2021-05-07 ENCOUNTER — Other Ambulatory Visit: Payer: Self-pay

## 2021-05-11 ENCOUNTER — Other Ambulatory Visit (HOSPITAL_BASED_OUTPATIENT_CLINIC_OR_DEPARTMENT_OTHER): Payer: Self-pay

## 2021-05-12 ENCOUNTER — Other Ambulatory Visit (HOSPITAL_COMMUNITY): Payer: Self-pay

## 2021-05-12 ENCOUNTER — Other Ambulatory Visit: Payer: Self-pay

## 2021-05-18 ENCOUNTER — Other Ambulatory Visit (HOSPITAL_BASED_OUTPATIENT_CLINIC_OR_DEPARTMENT_OTHER): Payer: Self-pay

## 2021-05-24 ENCOUNTER — Other Ambulatory Visit (HOSPITAL_COMMUNITY): Payer: Self-pay

## 2021-05-25 ENCOUNTER — Other Ambulatory Visit (HOSPITAL_COMMUNITY): Payer: Self-pay

## 2021-06-04 ENCOUNTER — Other Ambulatory Visit: Payer: Self-pay

## 2021-06-07 ENCOUNTER — Other Ambulatory Visit (HOSPITAL_COMMUNITY): Payer: Self-pay

## 2021-06-07 ENCOUNTER — Other Ambulatory Visit: Payer: Self-pay | Admitting: Neurology

## 2021-06-09 ENCOUNTER — Other Ambulatory Visit (HOSPITAL_COMMUNITY): Payer: Self-pay

## 2021-06-11 ENCOUNTER — Other Ambulatory Visit (HOSPITAL_COMMUNITY): Payer: Self-pay

## 2021-06-14 ENCOUNTER — Other Ambulatory Visit (HOSPITAL_COMMUNITY): Payer: Self-pay

## 2021-06-14 MED ORDER — TRAMADOL HCL 50 MG PO TABS
ORAL_TABLET | ORAL | 0 refills | Status: DC
  Filled 2021-06-14: qty 60, 30d supply, fill #0

## 2021-06-23 ENCOUNTER — Other Ambulatory Visit: Payer: Self-pay

## 2021-06-23 ENCOUNTER — Other Ambulatory Visit (HOSPITAL_COMMUNITY): Payer: Self-pay

## 2021-06-23 MED ORDER — HYDROCODONE-ACETAMINOPHEN 5-325 MG PO TABS
ORAL_TABLET | ORAL | 0 refills | Status: AC
  Filled 2021-06-23: qty 20, 20d supply, fill #0

## 2021-06-23 MED ORDER — METOPROLOL SUCCINATE ER 50 MG PO TB24
50.0000 mg | ORAL_TABLET | Freq: Every day | ORAL | 1 refills | Status: DC
  Filled 2021-06-23: qty 30, 30d supply, fill #0

## 2021-06-24 ENCOUNTER — Other Ambulatory Visit (HOSPITAL_COMMUNITY): Payer: Self-pay

## 2021-06-30 ENCOUNTER — Other Ambulatory Visit: Payer: Self-pay

## 2021-06-30 ENCOUNTER — Other Ambulatory Visit (HOSPITAL_COMMUNITY): Payer: Self-pay

## 2021-06-30 MED ORDER — CYCLOBENZAPRINE HCL 10 MG PO TABS
ORAL_TABLET | Freq: Three times a day (TID) | ORAL | 0 refills | Status: AC | PRN
  Filled 2021-06-30 (×2): qty 90, 30d supply, fill #0

## 2021-07-01 ENCOUNTER — Other Ambulatory Visit (HOSPITAL_COMMUNITY): Payer: Self-pay

## 2021-07-02 ENCOUNTER — Other Ambulatory Visit (HOSPITAL_COMMUNITY): Payer: Self-pay

## 2021-07-05 ENCOUNTER — Other Ambulatory Visit (HOSPITAL_COMMUNITY): Payer: Self-pay

## 2021-07-07 ENCOUNTER — Other Ambulatory Visit (HOSPITAL_COMMUNITY): Payer: Self-pay

## 2021-07-13 ENCOUNTER — Encounter: Payer: Self-pay | Admitting: Neurology

## 2021-07-13 NOTE — Telephone Encounter (Signed)
error 

## 2021-07-13 NOTE — Telephone Encounter (Signed)
Called the patient back to discuss. There was no anwer. LVM asking for a call back * According to the [place] drug registry this medication was filled on 07/05/2021 through [facility]. Patient would not be due for another refill at this time. I see the patient is asking for this to be sent to another pharmacy. Called to verify if the patient picked up the medication from [facility], if not advise the patient he would need to cancel the refill through [facility] and then we can send the refill to the other pharmacy.

## 2021-07-13 NOTE — Telephone Encounter (Signed)
Pt is needing a refill request for his clonazePAM (KLONOPIN) 0.5 MG tablet sent to the [facility] on [address], [place],  [address] Pt has moved.

## 2021-07-15 ENCOUNTER — Other Ambulatory Visit (HOSPITAL_COMMUNITY): Payer: Self-pay

## 2021-07-15 ENCOUNTER — Other Ambulatory Visit: Payer: Self-pay | Admitting: Neurology

## 2021-07-15 MED ORDER — CLONAZEPAM 0.5 MG PO TABS: 0.2500 mg | ORAL_TABLET | Freq: Every day | ORAL | 5 refills | Status: DC

## 2021-07-15 NOTE — Telephone Encounter (Signed)
Called the [facility] and they advised that they did fill the medication on 07/05/2021 and shipped the patient's medication to the address in [place].  Pt will be due for next refill closer to end of November and we can send the refill to the new pharmacy at that time.   **If pt returns call please advise that [facility] shipped his med and the next refill due will be sent to the [facility] as described.

## 2021-07-15 NOTE — Telephone Encounter (Signed)
07/13/2021 Called the patient back to discuss. There was no anwer. LVM asking for a call back * According to the [place] drug registry this medication was filled on 07/05/2021 through [facility]. Patient would not be due for another refill at this time. I see the patient is asking for this to be sent to another pharmacy. Called to verify if the patient picked up the medication from [facility], if not advise the patient he would need to cancel the refill through [facility] and then we can send the refill to the other pharmacy.       07/13/2021 pt called in an Jael took the call.  Pt is needing a refill request for his clonazePAM (KLONOPIN) 0.5 MG tablet sent to the [facility] on [address], [place], [place] [address] Pt has moved.

## 2021-07-15 NOTE — Addendum Note (Signed)
Addended by: Darleen Crocker on: 07/15/2021 10:58 AM   Modules accepted: Orders

## 2021-08-04 ENCOUNTER — Other Ambulatory Visit (HOSPITAL_COMMUNITY): Payer: Self-pay

## 2021-09-20 ENCOUNTER — Other Ambulatory Visit (HOSPITAL_COMMUNITY): Payer: Self-pay

## 2021-09-20 ENCOUNTER — Other Ambulatory Visit: Payer: Self-pay

## 2021-10-25 ENCOUNTER — Ambulatory Visit: Payer: No Typology Code available for payment source | Admitting: Neurology

## 2021-11-29 DIAGNOSIS — Z0271 Encounter for disability determination: Secondary | ICD-10-CM

## 2021-12-15 ENCOUNTER — Encounter: Payer: Self-pay | Admitting: Neurology

## 2021-12-15 ENCOUNTER — Ambulatory Visit: Payer: BC Managed Care – PPO | Admitting: Neurology

## 2021-12-15 VITALS — BP 143/83 | HR 65 | Ht 71.0 in | Wt 211.0 lb

## 2021-12-15 DIAGNOSIS — R269 Unspecified abnormalities of gait and mobility: Secondary | ICD-10-CM

## 2021-12-15 DIAGNOSIS — G2581 Restless legs syndrome: Secondary | ICD-10-CM

## 2021-12-15 DIAGNOSIS — G4752 REM sleep behavior disorder: Secondary | ICD-10-CM | POA: Diagnosis not present

## 2021-12-15 DIAGNOSIS — G4701 Insomnia due to medical condition: Secondary | ICD-10-CM

## 2021-12-15 DIAGNOSIS — F02A0 Dementia in other diseases classified elsewhere, mild, without behavioral disturbance, psychotic disturbance, mood disturbance, and anxiety: Secondary | ICD-10-CM

## 2021-12-15 DIAGNOSIS — G3183 Dementia with Lewy bodies: Secondary | ICD-10-CM | POA: Diagnosis not present

## 2021-12-15 MED ORDER — RIVASTIGMINE TARTRATE 1.5 MG PO CAPS: ORAL_CAPSULE | ORAL | 5 refills | Status: DC

## 2021-12-15 MED ORDER — ROPINIROLE HCL 1 MG PO TABS: 1.0000 mg | ORAL_TABLET | Freq: Three times a day (TID) | ORAL | 4 refills | Status: DC

## 2021-12-15 MED ORDER — CLONAZEPAM 1 MG PO TABS: 0.5000 mg | ORAL_TABLET | Freq: Every day | ORAL | 1 refills | Status: DC

## 2021-12-15 NOTE — Progress Notes (Addendum)
? ?[facility] ? ?PATIENT: Roy Watson ?DOB: 1961-01-11 ? ?[facility] PCP: Dr. Ouida Sills ?SOURCE: patient, notes from PCP ? ?_________________________________ ? ? ?HISTORICAL ? ?CHIEF COMPLAINT:  ?Chief Complaint  ?Patient presents with  ? Follow-up  ?  Pt with wife, rm 1. Pt has had 3 falls in the last 3 months. He wasn't injured but has noticed there is weakness and balance difficulties  ? ? ?HISTORY OF PRESENT ILLNESS:  ?Roy Watson is a 61 y.o. man with tremor and gait disturbance ? ?UPDATE 12/15/2021: ?He has had a few falls the last couple months - one on driveway, one in house.  He has trouble catching self if he starts to fall. ? ?At times the tremor is worse.   He has more trouble with fine motor skills.   Both the tremor and weakness is worse on his left side.   He notes the tremor at both rest and intention.     His handwriting has changed - smaller and sloppier.   He has had difficulty doing some tasks like putting a belt on.    No change in voice.  Some trouble swallowing thi liquids but no food.      ? ?Gait is off balanced ad he is not comfortable on ladders.  He was working in maintenance.      He stumbles but no falls.   The stride is mildly reduced with some shuffling.  Marland Kitchen    He feels he is moving slower.    Balance is worse if he looks up.   He will hold a wall when looking up.   He does not feel safe on a 6 foot ladder anymore.   He fatigues a little bit more quickly if more active. ? ?He is on Requip and tolerates it well.    It helps his tremor but not gait.   He was placed on Sinemet CR by Dr. Ouida Sills but did not note a benefit and stopped    He was on for 2 months trial.     ? ?He still has a lot of  tossing and turning a lot at night.   He feels anxiety only at night.   He has trouble with both sleep onset and maintenance.    He has had a few active dreams, but none since the last visit when clonazepam was started.    He felt he was kicking someone attacking him  and nearly kicked his wife.   He has had 4-5 of these total. ? ?His wife had noted mild cognitive issues.   He feels he has trouble getting out some words. He has He has some non-threatening visual hallucinations (recalls 'seeing a girl fishing on the deck). ? ? ?IMAGING: ?MRI cervical spine 01/06/2021:  The spinal cord appears normal.     At C5-C6 there is mild spinal stenosis due to stable degenerative changes causing mild foraminal narrowing but no nerve root compression.   At C6-C7, there is mild spinal stenosis due to stable degenerative changes causing moderately severe right and mild to moderate left foraminal narrowing.  There is potential for right C7 nerve root compression. ? ?MRI brain 01/06/2021:  Brain parenchyma appears normal for age with just a single punctate T2/FLAIR hyperintense focus in the right frontal lobe, also seen on the previous MRI.  It likely represents a solitary focus of chronic microvascular ischemic change and is not significant at this age.   Prior bilateral antrostomy and ethmoidectomy surgery.  There is severe residual inflammatory disease, more than was noted in 2017.. ? ? ?REVIEW OF SYSTEMS: ?Constitutional: No fevers, chills, sweats, or change in appetite.  Fatigue ?Eyes: No visual changes, double vision, eye pain ?Ear, nose and throat: No hearing loss, ear pain, nasal congestion, sore throat ?Cardiovascular: No chest pain, palpitations ?Respiratory:  No shortness of breath at rest or with exertion.   No wheezes ?GastrointestinaI: No nausea, vomiting, diarrhea, abdominal pain, fecal incontinence.  He has constipation. ?Genitourinary:  No dysuria, urinary retention or frequency.  No nocturia. ?Musculoskeletal:  No neck pain, back pain ?Integumentary: No rash, pruritus, skin lesions ?Neurological: as above ?Psychiatric: No depression at this time.  No anxiety ?Endocrine: No palpitations, diaphoresis, change in appetite, change in weigh or increased thirst ?Hematologic/Lymphatic:   No anemia, purpura, petechiae. ?Allergic/Immunologic: No itchy/runny eyes, nasal congestion, recent allergic reactions, rashes ? ?ALLERGIES: ?No Known Allergies ? ?HOME MEDICATIONS: ? ?Current Outpatient Medications:  ?  cyclobenzaprine (FLEXERIL) 10 MG tablet, TAKE 1 TABLET BY MOUTH THREE TIMES DAILY AS NEEDED FOR MUSCLE SPASMS, Disp: 90 tablet, Rfl: 0 ?  gabapentin (NEURONTIN) 300 MG capsule, Take 1 capsule by mouth at bedtime, Disp: 30 capsule, Rfl: 11 ?  HYDROcodone-acetaminophen (NORCO/VICODIN) 5-325 MG tablet, Take 1 tablet by mouth at bedtime as needed, Disp: 20 tablet, Rfl: 0 ?  ibuprofen (ADVIL,MOTRIN) 200 MG tablet, Take 800 mg by mouth every 6 (six) hours as needed., Disp: , Rfl:  ?  metoprolol succinate (TOPROL-XL) 50 MG 24 hr tablet, Take 1 tablet (50 mg total) by mouth once daily, Disp: 90 tablet, Rfl: 1 ?  pantoprazole (PROTONIX) 20 MG tablet, Take 20 mg by mouth daily., Disp: , Rfl:  ?  pantoprazole (PROTONIX) 40 MG tablet, Take 1 tablet by mouth two times daily., Disp: 180 tablet, Rfl: 1 ?  polyethylene glycol powder (GLYCOLAX/MIRALAX) powder, 2 cap fulls in a full glass of water, three times a day, for 5 days., Disp: 255 g, Rfl: 0 ?  rivastigmine (EXELON) 1.5 MG capsule, One or two po bid, Disp: 120 capsule, Rfl: 5 ?  senna (SENOKOT) 8.6 MG TABS tablet, Take 2 tablets (17.2 mg total) by mouth 2 (two) times daily., Disp: 120 each, Rfl: 0 ?  traMADol (ULTRAM) 50 MG tablet, Take 1 tablet (50 mg total) by mouth 2 (two) times daily as needed for pain, Disp: 60 tablet, Rfl: 0 ?  clonazePAM (KLONOPIN) 1 MG tablet, Take 0.5-1 tablets (0.5-1 mg total) by mouth at bedtime., Disp: 90 tablet, Rfl: 1 ?  gabapentin (NEURONTIN) 300 MG capsule, TAKE 1 CAPSULE BY MOUTH NIGHTLY, Disp: 30 capsule, Rfl: 11 ?  rOPINIRole (REQUIP) 1 MG tablet, Take 1 tablet by mouth 3 times daily., Disp: 270 tablet, Rfl: 4 ?  sildenafil (VIAGRA) 100 MG tablet, TAKE 1 TABLET BY MOUTH ONCE DAILY AS NEEDED FOR ERECTILE DYSFUNCTION, Disp: 6  tablet, Rfl: 1 ? ?PAST MEDICAL HISTORY: ?Past Medical History:  ?Diagnosis Date  ? Hypertension   ? Low back pain   ? has had 2 back surgeries  ? ? ?PAST SURGICAL HISTORY: ?Past Surgical History:  ?Procedure Laterality Date  ? APPENDECTOMY    ? BACK SURGERY    ? x2 (1986, 2000)  ? KNEE ARTHROPLASTY Left   ? ? ?FAMILY HISTORY: ?Family History  ?Problem Relation Age of Onset  ? Hypertension Mother   ? Transient ischemic attack Mother   ? COPD Father   ? Hypertension Father   ? Diverticulitis Father   ? ? ?SOCIAL HISTORY: ? ?  Social History  ? ?Socioeconomic History  ? Marital status: Married  ?  Spouse name: Olin Hauser  ? Number of children: 2  ? Years of education: 7  ? Highest education level: Not on file  ?Occupational History  ? 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 Smoking status: Never  ? Smokeless tobacco: Never  ?Substance and Sexual Activity  ? Alcohol use: Yes  ? Drug use: No  ? Sexual activity: Not on file  ?Other Topics Concern  ? Not on file  ?Social History Narrative  ? Right handed  ? Lives  ? Caffeine use: 1 per day  ? ?Social Determinants of Health  ? ?Financial Resource Strain: Not on file  ?Food Insecurity: Not on file  ?Transportation Needs: Not on file  ?Physical Activity: Not on file  ?Stress: Not on file  ?Social Connections: Not on file  ?Intimate Partner Violence: Not on file  ? ? ? ?PHYSICAL EXAM ? ?Vitals:  ? 12/15/21 0809  ?BP: (!) 143/83  ?Pulse: 65  ?Weight: 211 lb (95.7 kg)  ?Height: '5\' 11"'$  (1.803 m)  ? ? ?Body mass index is 29.43 kg/m?. ? ? ?General: The patient is well-developed and well-nourished and in no acute distress ? ?HEENT:  Head is [place]/AT.  Sclera are anicteric.   ? ?Skin: Extremities are without rash or  edema. ? ?Neurologic Exam ? ?Mental status: The patient is alert and oriented x 3 at the time of the examination. The patient has apparent normal recent and remote memory, with an apparently normal attention span and concentration ability.   Speech is  normal. ? ?Cranial nerves: Extraocular movements are full. Facial strength is normal.  Trapezius and sternocleidomastoid strength is normal. No dysarthria is noted.  Hearing seems normal. ? ?Motor: He appears br

## 2022-02-28 ENCOUNTER — Other Ambulatory Visit: Payer: Self-pay | Admitting: *Deleted

## 2022-02-28 MED ORDER — ROPINIROLE HCL 1 MG PO TABS: 1.0000 mg | ORAL_TABLET | Freq: Three times a day (TID) | ORAL | 1 refills | Status: DC

## 2022-06-19 ENCOUNTER — Other Ambulatory Visit: Payer: Self-pay | Admitting: Neurology

## 2022-06-23 ENCOUNTER — Other Ambulatory Visit: Payer: Self-pay | Admitting: Neurology

## 2022-06-27 NOTE — Telephone Encounter (Signed)
Meds ordered this encounter  Medications   clonazePAM (KLONOPIN) 1 MG tablet    Sig: Take 1 tablet (1 mg total) by mouth at bedtime.    Dispense:  90 tablet    Refill:  0    This request is for a new prescription for a controlled substance as required by Federal/State law.NEED REFILLS.

## 2022-07-07 ENCOUNTER — Ambulatory Visit: Payer: BC Managed Care – PPO | Admitting: Neurology

## 2022-07-07 ENCOUNTER — Encounter: Payer: Self-pay | Admitting: Neurology

## 2022-07-07 VITALS — BP 149/87 | HR 77 | Ht 71.0 in | Wt 211.4 lb

## 2022-07-07 DIAGNOSIS — G4752 REM sleep behavior disorder: Secondary | ICD-10-CM | POA: Diagnosis not present

## 2022-07-07 DIAGNOSIS — R269 Unspecified abnormalities of gait and mobility: Secondary | ICD-10-CM

## 2022-07-07 DIAGNOSIS — G2581 Restless legs syndrome: Secondary | ICD-10-CM | POA: Diagnosis not present

## 2022-07-07 DIAGNOSIS — F02A0 Dementia in other diseases classified elsewhere, mild, without behavioral disturbance, psychotic disturbance, mood disturbance, and anxiety: Secondary | ICD-10-CM

## 2022-07-07 DIAGNOSIS — G3183 Dementia with Lewy bodies: Secondary | ICD-10-CM | POA: Diagnosis not present

## 2022-07-07 MED ORDER — SERTRALINE HCL 50 MG PO TABS: 50.0000 mg | ORAL_TABLET | Freq: Every day | ORAL | 3 refills | Status: DC

## 2022-07-07 NOTE — Progress Notes (Signed)
[facility]  PATIENT: Roy Watson DOB: 1960/12/06  REFERRING DOCTOR OR PCP: Dr. Ouida Sills SOURCE: patient, notes from PCP  _________________________________   HISTORICAL  CHIEF COMPLAINT:  Chief Complaint  Patient presents with   Follow-up    RM 1 with wife. Last seen 12/15/21.  Stumbling more, L leg harder to navigate. L arm shakes more. Has had some near falls since last visit. Having more weakness in legs.    HISTORY OF PRESENT ILLNESS:  Roy Watson is a 61 y.o. man with tremor and gait disturbance  UPDATE 07/07/2022: He feels he has progressed some, especially with gait.    He has had a few trips but no actual falls the last couple months..  He has trouble catching self if he starts to fall.   Turning is poor, especially to the left as he lifts the left leg.     He has noted more difficulty with fine motor skills.  He continues to have a mild tremor on the left but not on the right.  He feels the left hand is slightly weak.   He notes the tremor at both rest and intention.     His handwriting has changed - smaller and sloppier.   He has had difficulty doing some tasks like putting a belt on.    No change in voice.  Some trouble swallowing thi liquids but no food.       Gait is off balanced.  He used to work in [employer].  He is unable to climb ladders now.      He stumbles but no falls.   The stride is mildly reduced with some shuffling.  Marland Kitchen    He feels he is moving slower.    Balance is worse if he looks up.   He will hold a wall when looking up.  He notes some fatigue.  He is on Requip and tolerates it well.    It helps his tremor but not gait.   He was placed on Sinemet CR by Dr. Ouida Sills but did not note a benefit and stopped    He was on for 2 months trial.    He tried Exelon for the mild cognitive aspects (I.e. visual hallucinations) and he felt it helped but he stopped since those symptoms had been mild and he stopped the medication.      He feels  anxiety only at night.   Clonazepam has helped his insomnia and has eliminated the more violent kicking/punching RBD.  Occasionally he mumbles in his sleep.   Bladder function is fine.  He has ED.  His wife had noted mild cognitive issues.   He feels he has trouble getting out some words.  He continues to have occasional nonthreatening visual hallucinations.  He has some depression and is frustrated that he can't do some things now.    He did cognitive testing as part of his disability application.   He did property maintenance work in past.    He did get his disability.   He only drives short distances now.    IMAGING: MRI cervical spine 01/06/2021:  The spinal cord appears normal.     At C5-C6 there is mild spinal stenosis due to stable degenerative changes causing mild foraminal narrowing but no nerve root compression.   At C6-C7, there is mild spinal stenosis due to stable degenerative changes causing moderately severe right and mild to moderate left foraminal narrowing.  There is potential for right C7 nerve  root compression.  MRI brain 01/06/2021:  Brain parenchyma appears normal for age with just a single punctate T2/FLAIR hyperintense focus in the right frontal lobe, also seen on the previous MRI.  It likely represents a solitary focus of chronic microvascular ischemic change and is not significant at this age.   Prior bilateral antrostomy and ethmoidectomy surgery.  There is severe residual inflammatory disease, more than was noted in 2017.Marland Kitchen   REVIEW OF SYSTEMS: Constitutional: No fevers, chills, sweats, or change in appetite.  Fatigue Eyes: No visual changes, double vision, eye pain Ear, nose and throat: No hearing loss, ear pain, nasal congestion, sore throat Cardiovascular: No chest pain, palpitations Respiratory:  No shortness of breath at rest or with exertion.   No wheezes GastrointestinaI: No nausea, vomiting, diarrhea, abdominal pain, fecal incontinence.  He has  constipation. Genitourinary:  No dysuria, urinary retention or frequency.  No nocturia. Musculoskeletal:  No neck pain, back pain Integumentary: No rash, pruritus, skin lesions Neurological: as above Psychiatric: No depression at this time.  No anxiety Endocrine: No palpitations, diaphoresis, change in appetite, change in weigh or increased thirst Hematologic/Lymphatic:  No anemia, purpura, petechiae. Allergic/Immunologic: No itchy/runny eyes, nasal congestion, recent allergic reactions, rashes  ALLERGIES: No Known Allergies  HOME MEDICATIONS:  Current Outpatient Medications:    amLODipine (NORVASC) 10 MG tablet, Take 10 mg by mouth daily., Disp: , Rfl:    clonazePAM (KLONOPIN) 1 MG tablet, Take 1 tablet (1 mg total) by mouth at bedtime., Disp: 90 tablet, Rfl: 0   cyclobenzaprine (FLEXERIL) 10 MG tablet, Take 10 mg by mouth daily as needed for muscle spasms., Disp: , Rfl:    gabapentin (NEURONTIN) 300 MG capsule, Take 1 capsule by mouth at bedtime, Disp: 30 capsule, Rfl: 11   HYDROcodone-acetaminophen (NORCO/VICODIN) 5-325 MG tablet, Take 1 tablet by mouth at bedtime as needed (Patient taking differently: Take 1 tablet by mouth. 1-2 times daily), Disp: 20 tablet, Rfl: 0   ibuprofen (ADVIL,MOTRIN) 200 MG tablet, Take 800 mg by mouth every 6 (six) hours as needed., Disp: , Rfl:    pantoprazole (PROTONIX) 40 MG tablet, Take 1 tablet by mouth two times daily., Disp: 180 tablet, Rfl: 1   polyethylene glycol powder (GLYCOLAX/MIRALAX) powder, 2 cap fulls in a full glass of water, three times a day, for 5 days., Disp: 255 g, Rfl: 0   rOPINIRole (REQUIP) 1 MG tablet, Take 1 tablet by mouth 3 times daily., Disp: 270 tablet, Rfl: 1   sertraline (ZOLOFT) 50 MG tablet, Take 1 tablet (50 mg total) by mouth daily., Disp: 90 tablet, Rfl: 3   sildenafil (VIAGRA) 100 MG tablet, TAKE 1 TABLET BY MOUTH ONCE DAILY AS NEEDED FOR ERECTILE DYSFUNCTION, Disp: 6 tablet, Rfl: 1  PAST MEDICAL HISTORY: Past Medical  History:  Diagnosis Date   Hypertension    Low back pain    has had 2 back surgeries    PAST SURGICAL HISTORY: Past Surgical History:  Procedure Laterality Date   APPENDECTOMY     BACK SURGERY     x2 (1986, 2000)   KNEE ARTHROPLASTY Left     FAMILY HISTORY: Family History  Problem Relation Age of Onset   Hypertension Mother    Transient ischemic attack Mother    COPD Father    Hypertension Father    Diverticulitis Father     SOCIAL HISTORY:  Social History   Socioeconomic History   Marital status: Married    Spouse name: Olin Hauser   Number of children: 2  Years of education: 21   Highest education level: Not on file  Occupational History   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   Tobacco Use   Smoking status: Never   Smokeless tobacco: Never  Substance and Sexual Activity   Alcohol use: Yes   Drug use: No   Sexual activity: Not on file  Other Topics Concern   Not on file  Social History Narrative   Right handed   Lives   Caffeine use: 1 per day   Social Determinants of Health   Financial Resource Strain: Not on file  Food Insecurity: Not on file  Transportation Needs: Not on file  Physical Activity: Not on file  Stress: Not on file  Social Connections: Not on file  Intimate Partner Violence: Not on file     PHYSICAL EXAM  Vitals:   07/07/22 1110  BP: (!) 149/87  Pulse: 77  Weight: 211 lb 6.4 oz (95.9 kg)  Height: '5\' 11"'$  (1.803 m)    Body mass index is 29.48 kg/m.   General: The patient is well-developed and well-nourished and in no acute distress  HEENT:  Head is /AT.  Sclera are anicteric.    Skin: Extremities are without rash or  edema.  Neurologic Exam  Mental status: The patient is alert and oriented x 3 at the time of the examination. The patient has apparent normal recent and remote memory, with an apparently normal attention span and concentration ability.   Speech is normal.  Cranial nerves: Extraocular movements are full.  Facial strength is normal.  Trapezius and sternocleidomastoid strength is normal. No dysarthria is noted.  Hearing seems normal.  Motor: He appears bradykinetic.  Mild increase tone, with slight cog-wheeling on the left, normal on right.   Muscle bulk is normal.   Tone is normal. Strength is  5 / 5 in all 4 extremities.   Sensory: Sensory testing is intact to pinprick, soft touch and vibration sensation in all 4 extremities.  Coordination: Cerebellar testing reveals good finger-nose-finger and heel-to-shin bilaterally.  Gait and station: Station is normal.    The gait has a reduced stride and  he takes 4 steps to turn 180 degrees.   He has retropulsion.  He is unable to do tandem walk.  Romberg is negative.  Reflexes: Deep tendon reflexes are symmetric and normal in the arms but increased in the knees with spread.  He is not having ankle clonus. Marland Kitchen    DIAGNOSTIC DATA (LABS, IMAGING, TESTING) - I reviewed patient records, labs, notes, testing and imaging myself where available.  Lab Results  Component Value Date   WBC 8.6 08/18/2016   HGB 15.7 08/18/2016   HCT 45.6 08/18/2016   MCV 89.4 08/18/2016   PLT 301 08/18/2016      Component Value Date/Time   NA 139 08/18/2016 1833   K 3.6 08/18/2016 1833   CL 105 08/18/2016 1833   CO2 22 08/18/2016 1833   GLUCOSE 88 08/18/2016 1833   BUN 12 08/18/2016 1833   CREATININE 1.00 07/24/2018 1602   CALCIUM 9.8 08/18/2016 1833   PROT 7.1 08/18/2016 1833   ALBUMIN 4.6 08/18/2016 1833   AST 29 08/18/2016 1833   ALT 33 08/18/2016 1833   ALKPHOS 91 08/18/2016 1833   BILITOT 0.5 08/18/2016 1833   GFRNONAA >60 08/18/2016 1833   GFRAA >60 08/18/2016 1833       ASSESSMENT AND PLAN  Mild Lewy body dementia, unspecified whether behavioral, psychotic, or mood disturbance or anxiety ([place])  Gait disturbance  Restless leg syndrome  REM behavioral disorder   Symptoms are most consistent with Lewy body dementia.  He did not get a benefit  from Sinemet.  He will continue open normal for the mild left hand tremor.   Continue clonazepam 1 mg nightly for REM behavior disorder.  He has not had any more violent dreams. Due to his poor balance, reduced gait and other symptoms, he is unable to work. If cognitive issues worsen we will restart Exelon. He will return to see [place] in 6 months or sooner for new or worsening neurologic symptoms.    40-minute office visit with the majority of the time spent face-to-face for history and physical, discussion/counseling and decision-making.  Additional time with record review and documentation.    Giah Fickett A. Felecia Shelling, MD, [facility] [id number], 7:86 PM Certified in Neurology, Clinical Neurophysiology, Sleep Medicine and Neuroimaging  [facility] [address] [place], [place] [address] [phone]

## 2022-12-29 ENCOUNTER — Other Ambulatory Visit: Payer: Self-pay | Admitting: Neurology

## 2023-01-02 ENCOUNTER — Encounter: Payer: Self-pay | Admitting: Neurology

## 2023-01-02 ENCOUNTER — Ambulatory Visit: Payer: BC Managed Care – PPO | Admitting: Neurology

## 2023-01-02 VITALS — BP 131/83 | HR 75 | Ht 71.0 in | Wt 215.2 lb

## 2023-01-02 DIAGNOSIS — G3183 Dementia with Lewy bodies: Secondary | ICD-10-CM | POA: Diagnosis not present

## 2023-01-02 DIAGNOSIS — G4752 REM sleep behavior disorder: Secondary | ICD-10-CM

## 2023-01-02 DIAGNOSIS — M545 Low back pain, unspecified: Secondary | ICD-10-CM

## 2023-01-02 DIAGNOSIS — R0683 Snoring: Secondary | ICD-10-CM

## 2023-01-02 DIAGNOSIS — R269 Unspecified abnormalities of gait and mobility: Secondary | ICD-10-CM

## 2023-01-02 DIAGNOSIS — G2581 Restless legs syndrome: Secondary | ICD-10-CM | POA: Diagnosis not present

## 2023-01-02 DIAGNOSIS — G4719 Other hypersomnia: Secondary | ICD-10-CM

## 2023-01-02 DIAGNOSIS — G8929 Other chronic pain: Secondary | ICD-10-CM

## 2023-01-02 DIAGNOSIS — F02A0 Dementia in other diseases classified elsewhere, mild, without behavioral disturbance, psychotic disturbance, mood disturbance, and anxiety: Secondary | ICD-10-CM

## 2023-01-02 MED ORDER — RIVASTIGMINE TARTRATE 1.5 MG PO CAPS: ORAL_CAPSULE | ORAL | 1 refills | Status: DC

## 2023-01-02 MED ORDER — ROPINIROLE HCL 2 MG PO TABS: 1.0000 mg | ORAL_TABLET | Freq: Three times a day (TID) | ORAL | 3 refills | Status: DC

## 2023-01-02 NOTE — Progress Notes (Signed)
[facility]  PATIENT: Roy Watson DOB: May 13, 1961  REFERRING DOCTOR OR PCP: Dr. Dareen Piano SOURCE: patient, notes from PCP  _________________________________   HISTORICAL  CHIEF COMPLAINT:  Chief Complaint  Patient presents with   Follow-up    Pt in room 10,wife in room. Here for 6 month follow up gait disturbance. Pt said he fell as week, states he feels his balance is off. Wife states pt has nightmares are worsen pt starting to kick again. Pt said tremors are starting on right side more, still tremors in left hand.  Pain MD prescribes gabapentin  300 mg, cyclobenzaprine 10 mg would like to know if you would prescribe.    HISTORY OF PRESENT ILLNESS:  Roy Watson is a 62 y.o. man with tremor and gait disturbance  UPDATE 01/02/2023: He feels he has progressed some, especially with gait and tremor.    He has had a few trips but no actual falls the last couple months..  He has trouble catching self if he starts to fall.   Turning is poor, especially to the left as he lifts the left leg.    He has a left > right hand termor that has worsened.    He notes the tremor at both rest and intention.     His handwriting has changed - smaller and sloppier.   He has had difficulty doing some tasks like putting a belt on.    No change in voice.  Some trouble swallowing thi liquids but no food.     He is on Requip and tolerates it well.    It helps his tremor but not gait.   He was placed on Sinemet earlier but affect was very flat and it had not helped.     Gait is off balanced.    He is unable to climb ladders now.      He fell on a rock last week.   The stride is mildly reduced with some shuffling.  He take smultiple steps to turn.  He does worse on a lwn compared to a flat surface.    He feels he is moving slower.    Balance is worse if he looks up.   He notes some fatigue.  He tried Exelon for the mild cognitive aspects (I.e. visual hallucinations) and he felt it helped but  he stopped since those symptoms had been mild and he stopped the medication.      He has Rem behavior disorder with violent kicking/punching RBD.   He notes anxiety at night.   Clonazepam has helped his REM Behavior disorder and insomnia with night-time anxiety.  He has occasional more severe episodes - one recently he kicked the lamp.  He did better on 1 mg than on 0,5 mg of clonazepam..    Bladder function is fine.  He has ED.    He is on hydrocodone bid (second pill usually 2-3 hours before bedtime)  He has some depression and is frustrated that he can't do some things now.    He did cognitive testing as part of his disability application.   He did property maintenance work in past.    He did get his disability.   He only drives short distances now.    EPWORTH SLEEPINESS SCALE  On a scale of 0 - 3 what is the chance of dozing:  Sitting and Reading:   3 Watching TV:    2 Sitting inactive in a public place: 0 Passenger in car  for one hour: 0 Lying down to rest in the afternoon: 3 Sitting and talking to someone: 0 Sitting quietly after lunch:  3 In a car, stopped in traffic:  0  Total (out of 24):   11/24   mild EDS   IMAGING: MRI cervical spine 01/06/2021:  The spinal cord appears normal.     At C5-C6 there is mild spinal stenosis due to stable degenerative changes causing mild foraminal narrowing but no nerve root compression.   At C6-C7, there is mild spinal stenosis due to stable degenerative changes causing moderately severe right and mild to moderate left foraminal narrowing.  There is potential for right C7 nerve root compression.  MRI brain 01/06/2021:  Brain parenchyma appears normal for age with just a single punctate T2/FLAIR hyperintense focus in the right frontal lobe, also seen on the previous MRI.  It likely represents a solitary focus of chronic microvascular ischemic change and is not significant at this age.   Prior bilateral antrostomy and ethmoidectomy surgery.  There  is severe residual inflammatory disease, more than was noted in 2017.Marland Kitchen   REVIEW OF SYSTEMS: Constitutional: No fevers, chills, sweats, or change in appetite.  Fatigue Eyes: No visual changes, double vision, eye pain Ear, nose and throat: No hearing loss, ear pain, nasal congestion, sore throat Cardiovascular: No chest pain, palpitations Respiratory:  No shortness of breath at rest or with exertion.   No wheezes GastrointestinaI: No nausea, vomiting, diarrhea, abdominal pain, fecal incontinence.  He has constipation. Genitourinary:  No dysuria, urinary retention or frequency.  No nocturia. Musculoskeletal:  No neck pain, back pain Integumentary: No rash, pruritus, skin lesions Neurological: as above Psychiatric: No depression at this time.  No anxiety Endocrine: No palpitations, diaphoresis, change in appetite, change in weigh or increased thirst Hematologic/Lymphatic:  No anemia, purpura, petechiae. Allergic/Immunologic: No itchy/runny eyes, nasal congestion, recent allergic reactions, rashes  ALLERGIES: No Known Allergies  HOME MEDICATIONS:  Current Outpatient Medications:    amLODipine (NORVASC) 10 MG tablet, Take 10 mg by mouth daily., Disp: , Rfl:    cyclobenzaprine (FLEXERIL) 10 MG tablet, Take 10 mg by mouth daily as needed for muscle spasms., Disp: , Rfl:    gabapentin (NEURONTIN) 300 MG capsule, Take 1 capsule by mouth at bedtime, Disp: 30 capsule, Rfl: 11   HYDROcodone-acetaminophen (NORCO/VICODIN) 5-325 MG tablet, Take 1 tablet by mouth at bedtime as needed (Patient taking differently: Take 1 tablet by mouth. 1-2 times daily), Disp: 20 tablet, Rfl: 0   ibuprofen (ADVIL,MOTRIN) 200 MG tablet, Take 800 mg by mouth every 6 (six) hours as needed., Disp: , Rfl:    pantoprazole (PROTONIX) 40 MG tablet, Take 1 tablet by mouth two times daily., Disp: 180 tablet, Rfl: 1   polyethylene glycol powder (GLYCOLAX/MIRALAX) powder, 2 cap fulls in a full glass of water, three times a day, for  5 days., Disp: 255 g, Rfl: 0   sertraline (ZOLOFT) 50 MG tablet, Take 1 tablet (50 mg total) by mouth daily., Disp: 90 tablet, Rfl: 3   clonazePAM (KLONOPIN) 1 MG tablet, Take 1 tablet (1 mg total) by mouth at bedtime., Disp: 90 tablet, Rfl: 0   rivastigmine (EXELON) 1.5 MG capsule, TAKE 1 TO 2 CAPSULES BY MOUTH TWICE A DAY, Disp: 360 capsule, Rfl: 1   rOPINIRole (REQUIP) 2 MG tablet, Take 0.5 tablets (1 mg total) by mouth 3 (three) times daily., Disp: 270 tablet, Rfl: 3   sildenafil (VIAGRA) 100 MG tablet, TAKE 1 TABLET BY MOUTH ONCE DAILY  AS NEEDED FOR ERECTILE DYSFUNCTION, Disp: 6 tablet, Rfl: 1  PAST MEDICAL HISTORY: Past Medical History:  Diagnosis Date   Hypertension    Low back pain    has had 2 back surgeries    PAST SURGICAL HISTORY: Past Surgical History:  Procedure Laterality Date   APPENDECTOMY     BACK SURGERY     x2 (1986, 2000)   KNEE ARTHROPLASTY Left     FAMILY HISTORY: Family History  Problem Relation Age of Onset   Hypertension Mother    Transient ischemic attack Mother    COPD Father    Hypertension Father    Diverticulitis Father     SOCIAL HISTORY:  Social History   Socioeconomic History   Marital status: Married    Spouse name: Rinaldo Cloud   Number of children: 2   Years of education: 12   Highest education level: Not on file  Occupational History   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   Tobacco Use   Smoking status: Never   Smokeless tobacco: Never  Substance and Sexual Activity   Alcohol use: Yes   Drug use: No   Sexual activity: Not on file  Other Topics Concern   Not on file  Social History Narrative   Right handed   Lives   Caffeine use: 1 per day   Social Determinants of Health   Financial Resource Strain: Not on file  Food Insecurity: Not on file  Transportation Needs: Not on file  Physical Activity: Not on file  Stress: Not on file  Social Connections: Not on file  Intimate Partner Violence: Not on file     PHYSICAL  EXAM  Vitals:   01/02/23 1136  BP: 131/83  Pulse: 75  Weight: 215 lb 3.2 oz (97.6 kg)  Height: 5\' 11"  (1.803 m)    Body mass index is 30.01 kg/m.   General: The patient is well-developed and well-nourished and in no acute distress  HEENT:  Head is [place]/AT.  Sclera are anicteric.    Skin: Extremities are without rash or  edema.  Neurologic Exam  Mental status: The patient is alert and oriented x 3 at the time of the examination. The patient has apparent normal recent and remote memory, with an apparently normal attention span and concentration ability.   Speech is normal.  Cranial nerves: Extraocular movements are full. Facial strength is normal.  Trapezius and sternocleidomastoid strength is normal. No dysarthria is noted.  Hearing seems normal.  Motor: He is mildly bradykinetic.  Mild increase tone, with slight cog-wheeling on the left, normal on right.   Muscle bulk is normal.   Tone is normal. Strength is  5 / 5 in all 4 extremities.   Sensory: Sensory testing is intact to pinprick, soft touch and vibration sensation in all 4 extremities.  Coordination: Cerebellar testing reveals good finger-nose-finger and heel-to-shin bilaterally.  Gait and station: Station is normal.    The gait has a reduced stride and  he takes 5 steps to turn 180 degrees.   He has retropulsion.  He is unable to do tandem walk.  Romberg is negative.  Reflexes: Deep tendon reflexes are symmetric and normal in the arms but increased in the knees with spread.  He is not having ankle clonus. Marland Kitchen    DIAGNOSTIC DATA (LABS, IMAGING, TESTING) - I reviewed patient records, labs, notes, testing and imaging myself where available.  Lab Results  Component Value Date   WBC 8.6 08/18/2016   HGB 15.7 08/18/2016  HCT 45.6 08/18/2016   MCV 89.4 08/18/2016   PLT 301 08/18/2016      Component Value Date/Time   NA 139 08/18/2016 1833   K 3.6 08/18/2016 1833   CL 105 08/18/2016 1833   CO2 22 08/18/2016 1833    GLUCOSE 88 08/18/2016 1833   BUN 12 08/18/2016 1833   CREATININE 1.00 07/24/2018 1602   CALCIUM 9.8 08/18/2016 1833   PROT 7.1 08/18/2016 1833   ALBUMIN 4.6 08/18/2016 1833   AST 29 08/18/2016 1833   ALT 33 08/18/2016 1833   ALKPHOS 91 08/18/2016 1833   BILITOT 0.5 08/18/2016 1833   GFRNONAA >60 08/18/2016 1833   GFRAA >60 08/18/2016 1833       ASSESSMENT AND PLAN  Mild Lewy body dementia, unspecified whether behavioral, psychotic, or mood disturbance or anxiety - Plan: PSG SLEEP STUDY  Gait disturbance  REM behavioral disorder - Plan: PSG SLEEP STUDY  Restless leg syndrome - Plan: PSG SLEEP STUDY  Snoring - Plan: PSG SLEEP STUDY  Excessive daytime sleepiness - Plan: PSG SLEEP STUDY  Chronic midline low back pain, unspecified whether sciatica present   Symptoms are most consistent with Lewy body dementia.   REM Behavior Disturbance is not well controlled on 0.5 mg and I recommend we go up to 1 mg nightly (he was on this dose for a few months last year and did better).   He has been required to get controlled substances through his pain management group.   Increase ropinirole for RLS and tremor Restart Exelon.  They are looking onto a drug study Check PSG and consider CPAP if OSA He will return to see [place] in 6 months or sooner for new or worsening neurologic symptoms.    43-minute office visit with the majority of the time spent face-to-face for history and physical, discussion/counseling and decision-making.  Additional time with record review and documentation.    Beckie Viscardi A. Epimenio Foot, MD, [facility] 01/02/2023, 12:23 PM Certified in Neurology, Clinical Neurophysiology, Sleep Medicine and Neuroimaging  [facility] [address] [place], [place] [address] [phone]

## 2023-03-31 ENCOUNTER — Other Ambulatory Visit: Payer: Self-pay | Admitting: Neurology

## 2023-03-31 MED ORDER — ROPINIROLE HCL 2 MG PO TABS: ORAL_TABLET | ORAL | 3 refills | Status: DC

## 2023-05-08 ENCOUNTER — Other Ambulatory Visit: Payer: Self-pay | Admitting: Neurology

## 2023-06-26 ENCOUNTER — Telehealth: Payer: Self-pay | Admitting: Neurology

## 2023-06-26 NOTE — Telephone Encounter (Signed)
Phone room: Please call pt back. Last seen 01/02/23 and next f/u 12/28/23.  However, he last refilled clonazepam 06/13/23 #30 by PCP, Sandria Manly, MD. He will need to contact PCP about this

## 2023-06-26 NOTE — Telephone Encounter (Signed)
Pt request refill for clonazePAM (KLONOPIN) 1 MG tablet (Expired) send to  [facility] [phone]

## 2023-07-10 ENCOUNTER — Ambulatory Visit: Payer: BC Managed Care – PPO | Admitting: Neurology

## 2023-12-28 ENCOUNTER — Encounter: Payer: Self-pay | Admitting: Neurology

## 2023-12-28 ENCOUNTER — Ambulatory Visit (INDEPENDENT_AMBULATORY_CARE_PROVIDER_SITE_OTHER): Payer: BC Managed Care – PPO | Admitting: Neurology

## 2023-12-28 VITALS — BP 137/80 | HR 80 | Ht 71.0 in | Wt 211.0 lb

## 2023-12-28 DIAGNOSIS — G4752 REM sleep behavior disorder: Secondary | ICD-10-CM

## 2023-12-28 DIAGNOSIS — G2581 Restless legs syndrome: Secondary | ICD-10-CM | POA: Diagnosis not present

## 2023-12-28 DIAGNOSIS — F02A0 Dementia in other diseases classified elsewhere, mild, without behavioral disturbance, psychotic disturbance, mood disturbance, and anxiety: Secondary | ICD-10-CM

## 2023-12-28 DIAGNOSIS — G3183 Dementia with Lewy bodies: Secondary | ICD-10-CM | POA: Diagnosis not present

## 2023-12-28 DIAGNOSIS — R269 Unspecified abnormalities of gait and mobility: Secondary | ICD-10-CM

## 2023-12-28 MED ORDER — RIVASTIGMINE TARTRATE 1.5 MG PO CAPS: ORAL_CAPSULE | ORAL | 1 refills | Status: DC

## 2023-12-28 MED ORDER — SERTRALINE HCL 100 MG PO TABS: 100.0000 mg | ORAL_TABLET | Freq: Every day | ORAL | 3 refills | Status: AC

## 2023-12-28 NOTE — Progress Notes (Signed)
 [facility]  PATIENT: Roy Watson DOB: 09/03/1961  REFERRING DOCTOR OR PCP: Dr. Dareen Piano SOURCE: patient, notes from PCP  _________________________________   HISTORICAL  CHIEF COMPLAINT:  Chief Complaint  Patient presents with   Dementia    Rm10, wife present, Mild Lewy body dementia, unspecified whether behavioral, psychotic, or mood disturbance: pt stated has balance issues, legs tremors, abnormal gait, weakness, fatigue, has to take frequent breaks, frequent spastic jerks of arms and legs, would like antidepressant increased depression would like zoloft increased. Fine motor skills impossible with tremors, sexual dysfunction has become more of an issue on viagra. Memory has declined significantly and the ability to critically think.     HISTORY OF PRESENT ILLNESS:  Roy Watson is a 63 y.o. man with tremor and gait disturbance  UPDATE 12/28/2023: He notes some worsening.  He stumbles more and stride is reduced and sometimes hard to move quick.    Turning is poor, especially to the left as he lifts the left leg.    He has a left > right hand termor that has worsened.    He notes the tremor at both rest and intention.     His handwriting has changed - smaller and sloppier.   He has had difficulty doing some tasks like putting a belt on.    No change in voice.  Some trouble swallowing thi liquids but no food.     He is on Requip and tolerates it well.    It helps his tremor but not gait.   He was placed on Sinemet earlier but affect was very flat and it had not helped.     Gait is off balanced.   He takes multiple steps to turn.  He does worse on a lwn compared to a flat surface.    He feels he is moving slower.    Balance is worse if he looks up.   He notes some fatigue.  He tried Exelon for the mild cognitive aspects (I.e. visual hallucinations) and he felt it helped but he stopped since those symptoms were mild.  He has restarted but not taking  consistently.  He has Rem behavior disorder with violent kicking/punching RBD.   He notes anxiety at night.   Clonazepam has helped his REM Behavior disorder and insomnia with night-time anxiety.  He has occasional more severe episodes - one recently he kicked the lamp.  He did better on 1 mg than on 0,5 mg of clonazepam but pain management practice does not want him to be on th higher dose.   We discussed trying melatonin again.    Bladder function is fine.  He has ED.    He is on hydrocodone bid (second pill usually 2-3 hours before bedtime)  He has some depression and is frustrated that he can't do some things now.    He did cognitive testing as part of his disability application.   He did property maintenance work in past.    He did get his disability.   He feels sertaline helped initially but less now.   EPWORTH SLEEPINESS SCALE  On a scale of 0 - 3 what is the chance of dozing:  Sitting and Reading:   3 Watching TV:    2 Sitting inactive in a public place: 0 Passenger in car for one hour: 0 Lying down to rest in the afternoon: 3 Sitting and talking to someone: 0 Sitting quietly after lunch:  3 In a car, stopped in traffic:  0  Total (out of 24):   11/24   mild EDS   IMAGING: MRI cervical spine 01/06/2021:  The spinal cord appears normal.     At C5-C6 there is mild spinal stenosis due to stable degenerative changes causing mild foraminal narrowing but no nerve root compression.   At C6-C7, there is mild spinal stenosis due to stable degenerative changes causing moderately severe right and mild to moderate left foraminal narrowing.  There is potential for right C7 nerve root compression.  MRI brain 01/06/2021:  Brain parenchyma appears normal for age with just a single punctate T2/FLAIR hyperintense focus in the right frontal lobe, also seen on the previous MRI.  It likely represents a solitary focus of chronic microvascular ischemic change and is not significant at this age.   Prior  bilateral antrostomy and ethmoidectomy surgery.  There is severe residual inflammatory disease, more than was noted in 2017.Aaron Aas   REVIEW OF SYSTEMS: Constitutional: No fevers, chills, sweats, or change in appetite.  Fatigue Eyes: No visual changes, double vision, eye pain Ear, nose and throat: No hearing loss, ear pain, nasal congestion, sore throat Cardiovascular: No chest pain, palpitations Respiratory:  No shortness of breath at rest or with exertion.   No wheezes GastrointestinaI: No nausea, vomiting, diarrhea, abdominal pain, fecal incontinence.  He has constipation. Genitourinary:  No dysuria, urinary retention or frequency.  No nocturia. Musculoskeletal:  No neck pain, back pain Integumentary: No rash, pruritus, skin lesions Neurological: as above Psychiatric: No depression at this time.  No anxiety Endocrine: No palpitations, diaphoresis, change in appetite, change in weigh or increased thirst Hematologic/Lymphatic:  No anemia, purpura, petechiae. Allergic/Immunologic: No itchy/runny eyes, nasal congestion, recent allergic reactions, rashes  ALLERGIES: Allergies  Allergen Reactions   Carbidopa Other (See Comments)    other    HOME MEDICATIONS:  Current Outpatient Medications:    amLODipine (NORVASC) 10 MG tablet, Take 10 mg by mouth daily., Disp: , Rfl:    clonazePAM (KLONOPIN) 0.5 MG tablet, Take 0.5 mg by mouth at bedtime., Disp: , Rfl:    cyclobenzaprine (FLEXERIL) 10 MG tablet, Take 10 mg by mouth daily as needed for muscle spasms., Disp: , Rfl:    gabapentin (NEURONTIN) 300 MG capsule, Take 1 capsule by mouth at bedtime, Disp: 30 capsule, Rfl: 11   HYDROcodone-acetaminophen (NORCO/VICODIN) 5-325 MG tablet, Take 1 tablet by mouth at bedtime as needed (Patient taking differently: Take 1 tablet by mouth. 1-2 times daily), Disp: 20 tablet, Rfl: 0   ibuprofen (ADVIL,MOTRIN) 200 MG tablet, Take 800 mg by mouth every 6 (six) hours as needed., Disp: , Rfl:    LINZESS 290 MCG  CAPS capsule, Take 290 mcg by mouth every morning., Disp: , Rfl:    pantoprazole (PROTONIX) 40 MG tablet, Take 1 tablet by mouth two times daily., Disp: 180 tablet, Rfl: 1   polyethylene glycol powder (GLYCOLAX/MIRALAX) powder, 2 cap fulls in a full glass of water, three times a day, for 5 days., Disp: 255 g, Rfl: 0   rOPINIRole (REQUIP) 1 MG tablet, 1/2 to 1 pill po tid (Patient taking differently: Take 1 mg by mouth 3 (three) times daily. 1/2 to 1 pill po tid), Disp: 270 tablet, Rfl: 3   rivastigmine (EXELON) 1.5 MG capsule, TAKE 1 TO 2 CAPSULES BY MOUTH TWICE A DAY, Disp: 360 capsule, Rfl: 1   sertraline (ZOLOFT) 100 MG tablet, Take 1 tablet (100 mg total) by mouth daily., Disp: 90 tablet, Rfl: 3   sildenafil (VIAGRA) 100 MG  tablet, TAKE 1 TABLET BY MOUTH ONCE DAILY AS NEEDED FOR ERECTILE DYSFUNCTION, Disp: 6 tablet, Rfl: 1  PAST MEDICAL HISTORY: Past Medical History:  Diagnosis Date   Hypertension    Low back pain    has had 2 back surgeries    PAST SURGICAL HISTORY: Past Surgical History:  Procedure Laterality Date   APPENDECTOMY     BACK SURGERY     x2 (1986, 2000)   KNEE ARTHROPLASTY Left     FAMILY HISTORY: Family History  Problem Relation Age of Onset   Hypertension Mother    Transient ischemic attack Mother    COPD Father    Hypertension Father    Diverticulitis Father     SOCIAL HISTORY:  Social History   Socioeconomic History   Marital status: Married    Spouse name: Rinaldo Cloud   Number of children: 2   Years of education: 12   Highest education level: Not on file  Occupational History   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   Tobacco Use   Smoking status: Never   Smokeless tobacco: Never  Substance and Sexual Activity   Alcohol use: Yes   Drug use: No   Sexual activity: Not on file  Other Topics Concern   Not on file  Social History Narrative   Right handed   Lives   Caffeine use: 1 per day   Social Drivers of Health   Financial Resource Strain:  Not on file  Food Insecurity: Not on file  Transportation Needs: Not on file  Physical Activity: Not on file  Stress: Not on file  Social Connections: Not on file  Intimate Partner Violence: Not on file     PHYSICAL EXAM  Vitals:   12/28/23 1430 12/28/23 1434  BP: (!) 146/79 137/80  Pulse: 79 80  Weight: 211 lb (95.7 kg)   Height: 5\' 11"  (1.803 m)     Body mass index is 29.43 kg/m.   General: The patient is well-developed and well-nourished and in no acute distress  HEENT:  Head is [place]/AT.  Sclera are anicteric.    Skin: Extremities are without rash or  edema.  Neurologic Exam  Mental status: The patient is alert and oriented x 3 at the time of the examination. The patient has apparent normal recent and remote memory, with an apparently normal attention span and concentration ability.   Speech is normal.  Cranial nerves: Extraocular movements are full. Facial strength is normal.  Trapezius and sternocleidomastoid strength is normal. No dysarthria is noted.  Hearing seems normal.  Motor: He is mildly bradykinetic with a masked face.  Mild increase tone, with slight cog-wheeling on the left, normal on right.   Muscle bulk is normal.   Tone is normal. Strength is  5 / 5 in all 4 extremities.   Sensory: Sensory testing is intact to pinprick, soft touch and vibration sensation in all 4 extremities.  Coordination: Cerebellar testing reveals good finger-nose-finger and heel-to-shin bilaterally.  Gait and station: Station is normal.    The gait has a reduced stride.  He takes 5-6 steps to turn 180 degrees.   He has retropulsion.  He is unable to do tandem walk.  Romberg is negative.  Reflexes: Deep tendon reflexes are symmetric and normal in the arms but increased in the knees with spread.  He is not having ankle clonus. Marland Kitchen    DIAGNOSTIC DATA (LABS, IMAGING, TESTING) - I reviewed patient records, labs, notes, testing and imaging myself where available.  Lab  Results   Component Value Date   WBC 8.6 08/18/2016   HGB 15.7 08/18/2016   HCT 45.6 08/18/2016   MCV 89.4 08/18/2016   PLT 301 08/18/2016      Component Value Date/Time   NA 139 08/18/2016 1833   K 3.6 08/18/2016 1833   CL 105 08/18/2016 1833   CO2 22 08/18/2016 1833   GLUCOSE 88 08/18/2016 1833   BUN 12 08/18/2016 1833   CREATININE 1.00 07/24/2018 1602   CALCIUM 9.8 08/18/2016 1833   PROT 7.1 08/18/2016 1833   ALBUMIN 4.6 08/18/2016 1833   AST 29 08/18/2016 1833   ALT 33 08/18/2016 1833   ALKPHOS 91 08/18/2016 1833   BILITOT 0.5 08/18/2016 1833   GFRNONAA >60 08/18/2016 1833   GFRAA >60 08/18/2016 1833       ASSESSMENT AND PLAN  Mild Lewy body dementia, unspecified whether behavioral, psychotic, or mood disturbance or anxiety (HCC)  Gait disturbance  REM behavioral disorder  Restless leg syndrome   For the Lewy body dementia, continue ropinirole as it has helped the tremor.   REM Behavior Disturbance is not well controlled on 0.5 mg and I recommend he go up to 1 mg nightly (he was on this dose for a few months last year and did better).   He has been required to get controlled substances through his pain management group.   Melatonin 5 to 10 mg at night Restart Exelon.  They are looking onto a drug study he will return to see us  in 6 months or sooner for new or worsening neurologic symptoms.    40-minute office visit with the majority of the time spent face-to-face for history and physical, discussion/counseling and decision-making.  Additional time with record review and documentation.    This visit is part of a comprehensive longitudinal care medical relationship regarding the patients primary diagnosis of Lewy body disease and related concerns.   Mitsuo Budnick A. Godwin Lat, MD, [facility] 12/28/2023, 5:15 PM Certified in Neurology, Clinical Neurophysiology, Sleep Medicine and Neuroimaging  [facility] [address] [place], [place] [address] [phone]

## 2024-03-19 ENCOUNTER — Other Ambulatory Visit: Payer: Self-pay | Admitting: Neurology

## 2024-03-20 ENCOUNTER — Other Ambulatory Visit: Payer: Self-pay

## 2024-05-17 ENCOUNTER — Other Ambulatory Visit: Payer: Self-pay

## 2024-05-17 ENCOUNTER — Other Ambulatory Visit: Payer: Self-pay | Admitting: Neurology

## 2024-06-02 ENCOUNTER — Other Ambulatory Visit: Payer: Self-pay | Admitting: Neurology

## 2024-06-03 NOTE — Telephone Encounter (Signed)
 Last filled by patient 02/19/24 last visit was 12/28/23 next office visit has not been sheduled  Per 12/28/23 ov note patient is to return in 6 months ( October 2025 ) - note to make follow-up appointment has been sent with rx refill   - per 12/28/23 ov note continue ropinirole  as it has helped the tremor. REM Behavior Disturbance is not well controlled on 0.5 mg and I recommend he go up to 1 mg nightly (he was on this dose for a few months last year and did better).

## 2024-06-30 ENCOUNTER — Other Ambulatory Visit: Payer: Self-pay | Admitting: Neurology

## 2024-07-01 ENCOUNTER — Other Ambulatory Visit: Payer: Self-pay

## 2024-07-01 ENCOUNTER — Other Ambulatory Visit: Payer: Self-pay | Admitting: Neurology

## 2024-07-01 NOTE — Telephone Encounter (Signed)
 Last seen on 12/28/23 Follow up scheduled on 01/02/25    Dispensed Days Supply Quantity Provider Pharmacy  ROPINIROLE  1MG  TABLETS 06/03/2024 90 90 each Sater, Charlie LABOR, MD [facility] #...      Too soon to refill

## 2024-07-07 ENCOUNTER — Other Ambulatory Visit: Payer: Self-pay | Admitting: Neurology

## 2024-07-08 NOTE — Telephone Encounter (Signed)
 Last seen on 12/28/23 Follow up scheduled on 01/02/25 Patient on dose below

## 2024-07-24 ENCOUNTER — Other Ambulatory Visit: Payer: Self-pay | Admitting: Neurology

## 2024-07-25 ENCOUNTER — Telehealth: Payer: Self-pay | Admitting: Neurology

## 2024-07-25 ENCOUNTER — Other Ambulatory Visit: Payer: Self-pay | Admitting: Neurology

## 2024-07-25 MED ORDER — ROPINIROLE HCL 1 MG PO TABS: 1.0000 mg | ORAL_TABLET | Freq: Three times a day (TID) | ORAL | 11 refills | Status: AC

## 2024-07-25 NOTE — Telephone Encounter (Signed)
 Last seen on 12/28/23 Follow up scheduled 01/02/25

## 2024-07-25 NOTE — Telephone Encounter (Signed)
 Patient returned phone call,would like a call back.

## 2024-07-25 NOTE — Telephone Encounter (Signed)
 I called the patient and LVM (ok per DPR) asking for call back to discuss his Ropinirole  prescription. Would like to know exactly what he has been taking since his last visit before I talk to Dr Vear to confirm his dose going forward. Left office number in message.  Upon review of chart, pt previously had taken 0.5 mg TID but then Dr Duncan last OV 12/28/23 said the following:    REM Behavior Disturbance is not well controlled on 0.5 mg and I recommend he go up to 1 mg nightly (he was on this dose for a few months last year and did better).

## 2024-07-25 NOTE — Telephone Encounter (Signed)
 Pt has returned call to Luther, CHARITY FUNDRAISER. Please call pt back

## 2024-07-25 NOTE — Telephone Encounter (Signed)
 Pt is asking for a call from RN to discuss the directions for his rOPINIRole  (REQUIP ) 1 MG tablet

## 2024-07-25 NOTE — Telephone Encounter (Signed)
 Spoke with patient. He states he has been taking Ropinirole  1 mg TID and it works well for him. He previously was taking 0.5 mg TID but increased to 1 mg TID. I told him I would clarify with Dr Vear and call him back.

## 2024-07-31 ENCOUNTER — Telehealth: Payer: Self-pay | Admitting: Neurology

## 2024-07-31 NOTE — Telephone Encounter (Signed)
 Pt is asking for a call to discuss should pain he has been having for 3-4  weeks, his upcoming appointment is on wait list, please call

## 2024-08-01 NOTE — Telephone Encounter (Signed)
 Call to patient, who reports pain in both shoulders from his jerking and shakes related parkinson's. He reports barely being able to lift both arm and having to sleep in his recliner. He reports taking requip  as prescribed. He also saw an orthopedic MD last week and MRI was done but no results yet. He has Vicodin from previous back surgeries he has been taking but not helpful. I advised we don't prescribe pain maedication but I will send this to Dr. Vear to review. Patient was appreciative of call

## 2024-08-01 NOTE — Telephone Encounter (Signed)
 Pt has called to f/u on his request for medication for his shoulder pain.  Phone rep informed pt to allow 24-48 hours for a response.

## 2024-08-05 ENCOUNTER — Other Ambulatory Visit: Payer: Self-pay | Admitting: Neurology

## 2024-08-05 MED ORDER — METHYLPREDNISOLONE 4 MG PO TABS: ORAL_TABLET | ORAL | 0 refills | Status: AC

## 2024-08-05 NOTE — Telephone Encounter (Signed)
 Returned call to pt and stated  Dr. Vear sent in a steroid pack to see if that can help some with his pain. Pt voiced gratitude and understanding

## 2024-08-31 ENCOUNTER — Other Ambulatory Visit: Payer: Self-pay | Admitting: Neurology

## 2024-09-02 NOTE — Telephone Encounter (Signed)
Patient called to check on refill

## 2024-09-13 ENCOUNTER — Other Ambulatory Visit: Payer: Self-pay | Admitting: Neurology

## 2025-01-02 ENCOUNTER — Ambulatory Visit: Admitting: Neurology
